# Patient Record
Sex: Female | Born: 1985 | Race: White | Hispanic: No | Marital: Single | Smoking: Current some day smoker
Health system: Southern US, Community
[De-identification: ages and names within clinical notes are randomized; demographics above are authoritative.]

## PROBLEM LIST (undated history)

## (undated) DIAGNOSIS — F32A Depression, unspecified: Secondary | ICD-10-CM

## (undated) DIAGNOSIS — G7000 Myasthenia gravis without (acute) exacerbation: Secondary | ICD-10-CM

## (undated) DIAGNOSIS — R569 Unspecified convulsions: Secondary | ICD-10-CM

## (undated) DIAGNOSIS — F329 Major depressive disorder, single episode, unspecified: Secondary | ICD-10-CM

## (undated) HISTORY — PX: MOUTH SURGERY: SHX715

## (undated) HISTORY — PX: OTHER SURGICAL HISTORY: SHX169

## (undated) HISTORY — PX: LEG SURGERY: SHX1003

---
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
## 2006-08-28 ENCOUNTER — Emergency Department (HOSPITAL_COMMUNITY): Admission: EM | Admit: 2006-08-28 | Discharge: 2006-08-28 | Payer: Self-pay | Admitting: Emergency Medicine

## 2006-09-04 ENCOUNTER — Emergency Department (HOSPITAL_COMMUNITY): Admission: EM | Admit: 2006-09-04 | Discharge: 2006-09-04 | Payer: Self-pay | Admitting: Emergency Medicine

## 2006-09-06 ENCOUNTER — Emergency Department (HOSPITAL_COMMUNITY): Admission: EM | Admit: 2006-09-06 | Discharge: 2006-09-06 | Payer: Self-pay | Admitting: Emergency Medicine

## 2006-09-17 ENCOUNTER — Emergency Department (HOSPITAL_COMMUNITY): Admission: EM | Admit: 2006-09-17 | Discharge: 2006-09-17 | Payer: Self-pay | Admitting: Emergency Medicine

## 2006-10-31 ENCOUNTER — Encounter (INDEPENDENT_AMBULATORY_CARE_PROVIDER_SITE_OTHER): Payer: Self-pay | Admitting: Unknown Physician Specialty

## 2006-10-31 ENCOUNTER — Other Ambulatory Visit: Admission: RE | Admit: 2006-10-31 | Discharge: 2006-10-31 | Payer: Self-pay | Admitting: Unknown Physician Specialty

## 2007-05-09 ENCOUNTER — Emergency Department (HOSPITAL_COMMUNITY): Admission: EM | Admit: 2007-05-09 | Discharge: 2007-05-09 | Payer: Self-pay | Admitting: Emergency Medicine

## 2007-08-20 ENCOUNTER — Emergency Department (HOSPITAL_COMMUNITY): Admission: EM | Admit: 2007-08-20 | Discharge: 2007-08-20 | Payer: Self-pay | Admitting: Emergency Medicine

## 2007-12-25 ENCOUNTER — Other Ambulatory Visit: Admission: RE | Admit: 2007-12-25 | Discharge: 2007-12-25 | Payer: Self-pay | Admitting: Unknown Physician Specialty

## 2008-03-11 ENCOUNTER — Emergency Department (HOSPITAL_COMMUNITY): Admission: EM | Admit: 2008-03-11 | Discharge: 2008-03-11 | Payer: Self-pay | Admitting: Emergency Medicine

## 2008-05-17 ENCOUNTER — Emergency Department (HOSPITAL_COMMUNITY): Admission: EM | Admit: 2008-05-17 | Discharge: 2008-05-17 | Payer: Self-pay | Admitting: Emergency Medicine

## 2009-09-14 ENCOUNTER — Emergency Department (HOSPITAL_COMMUNITY): Admission: EM | Admit: 2009-09-14 | Discharge: 2009-09-14 | Payer: Self-pay | Admitting: Emergency Medicine

## 2009-12-12 ENCOUNTER — Emergency Department (HOSPITAL_COMMUNITY): Admission: EM | Admit: 2009-12-12 | Discharge: 2009-12-12 | Payer: Self-pay | Admitting: Emergency Medicine

## 2010-04-14 ENCOUNTER — Emergency Department (HOSPITAL_COMMUNITY)
Admission: EM | Admit: 2010-04-14 | Discharge: 2010-04-14 | Payer: Self-pay | Source: Home / Self Care | Admitting: Emergency Medicine

## 2010-06-13 LAB — HEPATIC FUNCTION PANEL
ALT: 10 U/L (ref 0–35)
Albumin: 3.5 g/dL (ref 3.5–5.2)
Bilirubin, Direct: <0.1 mg/dL (ref 0.0–0.3)
Total Bilirubin: 0.3 mg/dL (ref 0.3–1.2)
Total Protein: 6.2 g/dL (ref 6.0–8.3)

## 2010-06-13 LAB — DIFFERENTIAL
Basophils Absolute: 0 10*3/uL (ref 0.0–0.1)
Basophils Relative: 1 % (ref 0–1)
Lymphocytes Relative: 37 % (ref 12–46)
Monocytes Absolute: 0.6 10*3/uL (ref 0.1–1.0)
Neutro Abs: 2.4 10*3/uL (ref 1.7–7.7)

## 2010-06-13 LAB — URINALYSIS, ROUTINE W REFLEX MICROSCOPIC
Leukocytes, UA: NEGATIVE
Nitrite: NEGATIVE
Urobilinogen, UA: 0.2 mg/dL (ref 0.0–1.0)

## 2010-06-13 LAB — URINE MICROSCOPIC-ADD ON

## 2010-06-13 LAB — CBC
MCV: 83.3 fL (ref 78.0–100.0)
Platelets: 183 10*3/uL (ref 150–400)
RBC: 3.86 MIL/uL — ABNORMAL LOW (ref 3.87–5.11)
RDW: 16.2 % — ABNORMAL HIGH (ref 11.5–15.5)
WBC: 4.8 10*3/uL (ref 4.0–10.5)

## 2010-06-13 LAB — BASIC METABOLIC PANEL
Calcium: 8.9 mg/dL (ref 8.4–10.5)
Creatinine, Ser: 0.53 mg/dL (ref 0.4–1.2)
GFR calc Af Amer: >60 mL/min (ref >60)
GFR calc non Af Amer: >60 mL/min (ref >60)

## 2010-06-13 LAB — PREGNANCY, URINE: Preg Test, Ur: NEGATIVE

## 2010-06-28 ENCOUNTER — Emergency Department (HOSPITAL_COMMUNITY)
Admission: EM | Admit: 2010-06-28 | Discharge: 2010-06-28 | Disposition: A | Discharge status: Home / Self Care | Payer: Self-pay | Attending: Emergency Medicine | Admitting: Emergency Medicine

## 2010-06-28 DIAGNOSIS — K089 Disorder of teeth and supporting structures, unspecified: Secondary | ICD-10-CM | POA: Insufficient documentation

## 2010-07-11 ENCOUNTER — Emergency Department (HOSPITAL_COMMUNITY)
Admission: EM | Admit: 2010-07-11 | Discharge: 2010-07-11 | Disposition: A | Discharge status: Home / Self Care | Payer: Self-pay | Attending: Emergency Medicine | Admitting: Emergency Medicine

## 2010-07-11 DIAGNOSIS — K089 Disorder of teeth and supporting structures, unspecified: Secondary | ICD-10-CM | POA: Insufficient documentation

## 2010-07-11 DIAGNOSIS — N390 Urinary tract infection, site not specified: Secondary | ICD-10-CM | POA: Insufficient documentation

## 2010-07-11 DIAGNOSIS — R300 Dysuria: Secondary | ICD-10-CM | POA: Insufficient documentation

## 2010-07-11 LAB — URINALYSIS, ROUTINE W REFLEX MICROSCOPIC
Glucose, UA: NEGATIVE mg/dL
Protein, ur: 30 mg/dL — AB
Specific Gravity, Urine: 1.025 (ref 1.005–1.030)
Urobilinogen, UA: 0.2 mg/dL (ref 0.0–1.0)

## 2010-07-11 LAB — URINE MICROSCOPIC-ADD ON

## 2010-07-11 LAB — POCT PREGNANCY, URINE: Preg Test, Ur: NEGATIVE

## 2010-08-03 ENCOUNTER — Emergency Department (HOSPITAL_COMMUNITY)
Admission: EM | Admit: 2010-08-03 | Discharge: 2010-08-03 | Disposition: A | Discharge status: Home / Self Care | Payer: Self-pay | Attending: Emergency Medicine | Admitting: Emergency Medicine

## 2010-08-03 DIAGNOSIS — R110 Nausea: Secondary | ICD-10-CM | POA: Insufficient documentation

## 2010-08-03 DIAGNOSIS — K089 Disorder of teeth and supporting structures, unspecified: Secondary | ICD-10-CM | POA: Insufficient documentation

## 2010-08-03 DIAGNOSIS — G40802 Other epilepsy, not intractable, without status epilepticus: Secondary | ICD-10-CM | POA: Insufficient documentation

## 2010-08-03 DIAGNOSIS — G43909 Migraine, unspecified, not intractable, without status migrainosus: Secondary | ICD-10-CM | POA: Insufficient documentation

## 2010-08-03 DIAGNOSIS — K029 Dental caries, unspecified: Secondary | ICD-10-CM | POA: Insufficient documentation

## 2010-08-13 NOTE — Discharge Summary (Signed)
NAME:  Mallory Mitchell, Mallory Mitchell NO.:  [id number]   MEDICAL RECORD NO.:  [id number]          PATIENT TYPE:  IPS   LOCATION:  0301                          FACILITY:  [facility]   PHYSICIAN:  Jeanice Lim, M.D. DATE OF BIRTH:  May 22, 1985   DATE OF ADMISSION:  04/04/2005  DATE OF DISCHARGE:  04/07/2005                                 DISCHARGE SUMMARY   IDENTIFYING DATA:  This is an 25 year old Caucasian female, single,  voluntarily admitted with a history of polysubstance abuse since age 62,  requesting help with Xanax detox, having used Xanax up to 10 mg a day for  anxiety, getting this off the street.  Mother had kicked her out of the  house as per patient and she will live with aunt now.  Had been using Xanax  daily for two years with four months of being clean otherwise.  Initially,  Percocet.  Wants to be clean.  Would not mind if someone killed her or if  she died.  Describing positive suicidal ideation.  This is the first  inpatient treatment.   MEDICATIONS:  Xanax off the street, 10 tabs daily, peach or blue, occasional  Valium.   ALLERGIES:  No known drug allergies.   PHYSICAL EXAMINATION:  Physical and neurologic exam essentially within  normal limits.   MENTAL STATUS EXAM:  Fully alert, anxious affect, cooperative.  Poor eye  contact.  Speech within normal limits.  Mood anxious.  Thought processes  adequate with positive suicidal ideation without plan.  Cognition intact.  Judgment and insight impaired.  Impulse control questionable.   ADMISSION DIAGNOSES:  AXIS I:  Benzodiazepine dependence.  Partial  withdrawal syndrome.  Polysubstance abuse.  Mood disorder not otherwise  specified.  Rule out substance-induced mood disorder.  AXIS II:  None.  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  AXIS IV:  Moderate (stressors related to possible question regarding  stability of living situation and other sequelae of substance use.  AXIS V:  30/58.   HOSPITAL COURSE:  The patient was admitted and ordered routine p.r.n.  medications and underwent further monitoring.  Was encouraged to participate  in individual, group and milieu therapy.  The patient was placed on Librium  detox protocol for safe withdrawal.  Required multiple p.r.n.'s and also  monitored for opiate withdrawal.  The patient was decreased on Zoloft to  minimize mood induction and patient was stabilized on medications.  The  patient reported a positive response, participated in treatment, denied any  suicidal thoughts and her mood was stable.  Affect bright.  Judgment and  insight improved.  Thought processes goal directed.  There are no risk  issues, psychotic symptoms, dangerous ideation or withdrawal symptoms at the  time of discharge.  The patient was given medication education review again.   DISCHARGE MEDICATIONS:  1.  Zoloft 50 mg q.a.m.  2.  Depakene 250 mg and 5 mL, 15 mL totaling 750 mg at 8 p.m.  3.  Seroquel 50 mg, 1 every six hours as needed for agitation and 150 mg at  8 p.m.   FOLLOW UP:  The patient was to follow up with Electa Sniff on April 14, 2005  at 1:30 p.m.   DISCHARGE DIAGNOSES:  AXIS I:  Benzodiazepine dependence.  Partial  withdrawal syndrome.  Polysubstance abuse.  Mood disorder not otherwise  specified.  Rule out substance-induced mood disorder.  AXIS II:  None.  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  AXIS IV:  Moderate (stressors related to possible question regarding  stability of living situation and other sequelae of substance use.  AXIS V:  GAF on discharge 55.      Jeanice Lim, M.D.  Electronically Signed     JEM/MEDQ  D:  05/02/2005  T:  05/02/2005  Job:  [id number]

## 2010-08-18 ENCOUNTER — Emergency Department (HOSPITAL_COMMUNITY)
Admission: EM | Admit: 2010-08-18 | Discharge: 2010-08-18 | Disposition: A | Discharge status: Home / Self Care | Payer: Self-pay | Attending: Emergency Medicine | Admitting: Emergency Medicine

## 2010-08-18 DIAGNOSIS — G40802 Other epilepsy, not intractable, without status epilepticus: Secondary | ICD-10-CM | POA: Insufficient documentation

## 2010-08-18 DIAGNOSIS — G43909 Migraine, unspecified, not intractable, without status migrainosus: Secondary | ICD-10-CM | POA: Insufficient documentation

## 2010-08-18 DIAGNOSIS — K089 Disorder of teeth and supporting structures, unspecified: Secondary | ICD-10-CM | POA: Insufficient documentation

## 2010-09-12 ENCOUNTER — Emergency Department (HOSPITAL_COMMUNITY)
Admission: EM | Admit: 2010-09-12 | Discharge: 2010-09-12 | Disposition: A | Discharge status: Home / Self Care | Payer: Self-pay | Attending: Emergency Medicine | Admitting: Emergency Medicine

## 2010-09-12 DIAGNOSIS — K029 Dental caries, unspecified: Secondary | ICD-10-CM | POA: Insufficient documentation

## 2010-09-12 DIAGNOSIS — G40909 Epilepsy, unspecified, not intractable, without status epilepticus: Secondary | ICD-10-CM | POA: Insufficient documentation

## 2010-11-02 ENCOUNTER — Emergency Department (HOSPITAL_COMMUNITY)
Admission: EM | Admit: 2010-11-02 | Discharge: 2010-11-02 | Disposition: A | Discharge status: Home / Self Care | Payer: Self-pay | Attending: Emergency Medicine | Admitting: Emergency Medicine

## 2010-11-02 ENCOUNTER — Encounter: Payer: Self-pay | Admitting: *Deleted

## 2010-11-02 DIAGNOSIS — K0889 Other specified disorders of teeth and supporting structures: Secondary | ICD-10-CM

## 2010-11-02 DIAGNOSIS — F17200 Nicotine dependence, unspecified, uncomplicated: Secondary | ICD-10-CM | POA: Insufficient documentation

## 2010-11-02 DIAGNOSIS — K089 Disorder of teeth and supporting structures, unspecified: Secondary | ICD-10-CM | POA: Insufficient documentation

## 2010-11-02 DIAGNOSIS — K029 Dental caries, unspecified: Secondary | ICD-10-CM | POA: Insufficient documentation

## 2010-11-02 DIAGNOSIS — G40909 Epilepsy, unspecified, not intractable, without status epilepticus: Secondary | ICD-10-CM | POA: Insufficient documentation

## 2010-11-02 HISTORY — DX: Unspecified convulsions: R56.9

## 2010-11-02 MED ORDER — PENICILLIN V POTASSIUM 500 MG PO TABS: 500.0000 mg | ORAL_TABLET | Freq: Three times a day (TID) | ORAL | Status: AC

## 2010-11-02 MED ORDER — KETOROLAC TROMETHAMINE 60 MG/2ML IM SOLN
60.0000 mg | Freq: Once | INTRAMUSCULAR | Status: AC
  Administered 2010-11-02: 60 mg via INTRAMUSCULAR
  Filled 2010-11-02: qty 2

## 2010-11-02 NOTE — ED Notes (Signed)
Pt c/o toothache pain to top teeth

## 2010-11-02 NOTE — ED Provider Notes (Signed)
History     CSN: [id number] Arrival date & time: 11/02/2010  4:34 AM  Chief Complaint  Patient presents with  . Dental Pain   HPI Comments: Patient has a history of having frequent visits to the emergency department for toothaches. I saw her for the same problem in May of this year. She has been prescribed multiple prescriptions for narcotic medicines. During a recent psychiatric evaluation it was noted that she had been abusing Xanax and has a history of abusing Percocet.  Patient is a 25 y.o. female presenting with tooth pain. The history is provided by the patient and medical records.  Dental PainThe primary symptoms include mouth pain. Primary symptoms do not include dental injury, oral bleeding, oral lesions, headaches, fever, shortness of breath, sore throat, angioedema or cough. The symptoms began yesterday. The symptoms are unchanged. The symptoms are recurrent. The symptoms occur frequently.  Additional symptoms do not include: trismus, jaw pain, facial swelling, dry mouth, taste disturbance, ear pain, swollen glands and fatigue. Associated medical issues comments: Patient has a significant history for abusing Xanax and Percocet.    Past Medical History  Diagnosis Date  . Seizures     Past Surgical History  Procedure Date  . Mouth surgery     History reviewed. No pertinent family history.  History  Substance Use Topics  . Smoking status: Current Some Day Smoker  . Smokeless tobacco: Not on file  . Alcohol Use: No    OB History    Grav Para Term Preterm Abortions TAB SAB Ect Mult Living                  Review of Systems  Constitutional: Negative for fever and fatigue.  HENT: Positive for dental problem. Negative for ear pain, sore throat and facial swelling.   Respiratory: Negative for cough and shortness of breath.   Neurological: Negative for headaches.    Physical Exam  BP 116/62  Pulse 95  Temp(Src) 98.5 F (36.9 C) (Oral)  Resp 20  Ht 5\' 3"  (1.6 m)   Wt 125 lb (56.7 kg)  BMI 22.14 kg/m2  SpO2 100%  LMP 10/24/2010  Physical Exam  Nursing note and vitals reviewed. Constitutional: She appears well-developed and well-nourished. No distress.  HENT:  Head: Normocephalic and atraumatic.  Mouth/Throat: Oropharynx is clear and moist. No oropharyngeal exudate.       Dental Disease - diffuse over the diffuse upper teeth. Severe degradation of the left upper canine  Eyes: Conjunctivae are normal. No scleral icterus.  Neck: Normal range of motion. Neck supple. No thyromegaly present.  Cardiovascular: Normal rate and regular rhythm.   Pulmonary/Chest: Effort normal and breath sounds normal.  Lymphadenopathy:    She has no cervical adenopathy.  Neurological: She is alert.  Skin: Skin is warm and dry. No rash noted. She is not diaphoretic.    ED Course  Procedures  MDM History of drug-seeking behavior and substance abuse. Has obvious dental caries but no signs of periapical abscesses or gingival abscesses. Jaws are symmetrical and nontender and there is no cervical lymphadenopathy, fevers, tachycardia, hypotension. Patient has been given intramuscular Toradol while in the emergency department and informed that she would not receive any prescriptions for narcotics to go. Penicillin given. Patient has expressed her understanding of need for followup with a dentist.      Vida Roller, MD 11/02/10 [phone]

## 2010-12-17 LAB — URINALYSIS, ROUTINE W REFLEX MICROSCOPIC
Glucose, UA: NEGATIVE
Leukocytes, UA: NEGATIVE
Nitrite: NEGATIVE

## 2010-12-17 LAB — URINE MICROSCOPIC-ADD ON

## 2011-01-13 LAB — URINE MICROSCOPIC-ADD ON

## 2011-01-13 LAB — URINALYSIS, ROUTINE W REFLEX MICROSCOPIC

## 2011-01-13 LAB — URINE CULTURE
Colony Count: NO GROWTH
Culture: NO GROWTH

## 2011-01-29 ENCOUNTER — Emergency Department (HOSPITAL_COMMUNITY)
Admission: EM | Admit: 2011-01-29 | Discharge: 2011-01-29 | Disposition: A | Discharge status: Home / Self Care | Payer: Self-pay | Attending: Emergency Medicine | Admitting: Emergency Medicine

## 2011-01-29 ENCOUNTER — Encounter (HOSPITAL_COMMUNITY): Payer: Self-pay

## 2011-01-29 ENCOUNTER — Emergency Department (HOSPITAL_COMMUNITY): Payer: Self-pay

## 2011-01-29 DIAGNOSIS — R569 Unspecified convulsions: Secondary | ICD-10-CM | POA: Insufficient documentation

## 2011-01-29 DIAGNOSIS — J029 Acute pharyngitis, unspecified: Secondary | ICD-10-CM | POA: Insufficient documentation

## 2011-01-29 DIAGNOSIS — J069 Acute upper respiratory infection, unspecified: Secondary | ICD-10-CM | POA: Insufficient documentation

## 2011-01-29 HISTORY — DX: Major depressive disorder, single episode, unspecified: F32.9

## 2011-01-29 HISTORY — DX: Depression, unspecified: F32.A

## 2011-01-29 LAB — RAPID STREP SCREEN (MED CTR MEBANE ONLY): Streptococcus, Group A Screen (Direct): NEGATIVE

## 2011-01-29 LAB — POCT PREGNANCY, URINE: Preg Test, Ur: NEGATIVE

## 2011-01-29 MED ORDER — DEXAMETHASONE 4 MG PO TABS
4.0000 mg | ORAL_TABLET | Freq: Once | ORAL | Status: AC
  Administered 2011-01-29: 4 mg via ORAL
  Filled 2011-01-29: qty 1

## 2011-01-29 MED ORDER — DEXAMETHASONE 4 MG PO TABS
ORAL_TABLET | ORAL | Status: AC
  Filled 2011-01-29: qty 1

## 2011-01-29 NOTE — ED Notes (Signed)
Pt presents with sore throat, cough, headache, and laryngitis since 01/26/2011. Pt states she has a productive cough with green sputum. Pt denies fever.

## 2011-01-29 NOTE — ED Notes (Signed)
Pt states has "been like this for several days and will not get better. States lives with father and he is sick also. Pt has laryngitis with c/o sore throat. No cough heard. Pt denies fever and chills at this time.  No respiratory distress noted at this time.

## 2011-01-29 NOTE — ED Provider Notes (Signed)
History     CSN: [id number] Arrival date & time: 01/29/2011  5:16 PM   Chief Complaint  Patient presents with  . Sore Throat  . Laryngitis  . Cough     HPI Pt was seen at 1800.  Per pt, c/o gradual onset and persistence of constant runny/stuffy nose, sore throat, cough, "laryngitis," sinus and ears congestion x4 days.  Denies CP/palpitations, no SOB, no abd pain, no N/V/D, no fevers, no rash.    Past Medical History  Diagnosis Date  . Seizures   . Depression     Past Surgical History  Procedure Date  . Mouth surgery     Social History  . Marital Status: Single   Social History Main Topics  . Smoking status: Current Some Day Smoker  . Smokeless tobacco: None  . Alcohol Use: No  . Drug Use: No     Review of Systems ROS: Statement: All systems negative except as marked or noted in the HPI; Constitutional: Negative for fever and chills. ; ; Eyes: Negative for eye pain, redness and discharge. ; ; ENMT: Positive for ear pain, hoarseness, nasal congestion, sinus pressure and sore throat. ; ; Cardiovascular: Negative for chest pain, palpitations, diaphoresis, dyspnea and peripheral edema. ; ; Respiratory: +cough.  Negative for wheezing and stridor. ; ; Gastrointestinal: Negative for nausea, vomiting, diarrhea and abdominal pain, blood in stool, hematemesis, jaundice and rectal bleeding. . ; ; Genitourinary: Negative for dysuria, flank pain and hematuria. ; ; Musculoskeletal: Negative for back pain and neck pain. Negative for swelling and trauma.; ; Skin: Negative for pruritus, rash, abrasions, blisters, bruising and skin lesion.; ; Neuro: Negative for headache, lightheadedness and neck stiffness. Negative for weakness, altered level of consciousness , altered mental status, extremity weakness, paresthesias, involuntary movement, seizure and syncope.     Allergies  Review of patient's allergies indicates no known allergies.  Home Medications   Current Outpatient Rx  Name Route  Sig Dispense Refill  . DIVALPROEX SODIUM 500 MG PO TB24 Oral Take 500 mg by mouth 2 (two) times daily.        BP 115/59  Pulse 78  Temp(Src) 99.4 F (37.4 C) (Oral)  Resp 20  Ht 5\' 3"  (1.6 m)  Wt 130 lb (58.968 kg)  BMI 23.03 kg/m2  SpO2 100%  LMP 01/19/2011  Physical Exam 1805: Physical examination:  Nursing notes reviewed; Vital signs and O2 SAT reviewed;  Constitutional: Well developed, Well nourished, Well hydrated, In no acute distress; Head:  Normocephalic, atraumatic; Eyes: EOMI, PERRL, No scleral icterus; ENMT: TM's clear.  +edemetous nasal turbinates bilat with clear rhinorrhea. No intra-oral edema.  Mouth and pharynx normal, Mucous membranes moist; Neck: Supple, Full range of motion, No lymphadenopathy; Cardiovascular: Regular rate and rhythm, No murmur, rub, or gallop; Respiratory: Breath sounds clear & equal bilaterally, No rales, rhonchi, wheezes, or rub, Normal respiratory effort/excursion; Chest: Nontender, Movement normal; Extremities: Pulses normal, No tenderness, No edema, No calf edema or asymmetry.; Neuro: AA&Ox3, Major CN grossly intact.  No gross focal motor or sensory deficits in extremities.; Skin: Color normal, Warm, Dry, no rash.    ED Course  Procedures    MDM  MDM Reviewed: nursing note and vitals Interpretation: labs and x-ray   Results for orders placed during the hospital encounter of 01/29/11  RAPID STREP SCREEN      Component Value Range   Streptococcus, Group A Screen (Direct) NEGATIVE  NEGATIVE   POCT PREGNANCY, URINE      Component Value  Range   Preg Test, Ur NEGATIVE     Dg Chest 2 View  01/29/2011  *RADIOLOGY REPORT*  Clinical Data: Sore throat.  Horse.  CHEST - 2 VIEW  Comparison: None  Findings: Heart size is normal.  Mediastinal shadows are normal. The lungs are clear.  No effusions.  No bony abnormalities.  IMPRESSION: Normal chest  Original Report Authenticated By: Thomasenia Sales, M.D.   7:29 PM:  Pt wants to go home now.  Will tx  symptomatically at this time for viral illness. Dx testing d/w pt and family.  Questions answered.  Verb understanding, agreeable to d/c home with outpt f/u.   Threasa Kinch Allison Quarry, DO 01/31/11 1245

## 2011-08-08 ENCOUNTER — Emergency Department (HOSPITAL_COMMUNITY)
Admission: EM | Admit: 2011-08-08 | Discharge: 2011-08-08 | Disposition: A | Discharge status: Home / Self Care | Payer: Self-pay | Attending: Emergency Medicine | Admitting: Emergency Medicine

## 2011-08-08 ENCOUNTER — Encounter (HOSPITAL_COMMUNITY): Payer: Self-pay | Admitting: *Deleted

## 2011-08-08 DIAGNOSIS — F3289 Other specified depressive episodes: Secondary | ICD-10-CM | POA: Insufficient documentation

## 2011-08-08 DIAGNOSIS — K089 Disorder of teeth and supporting structures, unspecified: Secondary | ICD-10-CM | POA: Insufficient documentation

## 2011-08-08 DIAGNOSIS — K029 Dental caries, unspecified: Secondary | ICD-10-CM | POA: Insufficient documentation

## 2011-08-08 DIAGNOSIS — F329 Major depressive disorder, single episode, unspecified: Secondary | ICD-10-CM | POA: Insufficient documentation

## 2011-08-08 DIAGNOSIS — K0889 Other specified disorders of teeth and supporting structures: Secondary | ICD-10-CM

## 2011-08-08 MED ORDER — HYDROCODONE-ACETAMINOPHEN 5-325 MG PO TABS: 1.0000 | ORAL_TABLET | ORAL | Status: AC | PRN

## 2011-08-08 MED ORDER — IBUPROFEN 800 MG PO TABS
800.0000 mg | ORAL_TABLET | Freq: Once | ORAL | Status: AC
  Administered 2011-08-08: 800 mg via ORAL
  Filled 2011-08-08: qty 1

## 2011-08-08 MED ORDER — ONDANSETRON HCL 4 MG PO TABS
4.0000 mg | ORAL_TABLET | Freq: Once | ORAL | Status: AC
  Administered 2011-08-08: 4 mg via ORAL
  Filled 2011-08-08: qty 1

## 2011-08-08 MED ORDER — PENICILLIN V POTASSIUM 500 MG PO TABS: ORAL_TABLET | ORAL | Status: DC

## 2011-08-08 MED ORDER — HYDROCODONE-ACETAMINOPHEN 5-325 MG PO TABS
2.0000 | ORAL_TABLET | Freq: Once | ORAL | Status: AC
  Administered 2011-08-08: 2 via ORAL
  Filled 2011-08-08: qty 2

## 2011-08-08 MED ORDER — MELOXICAM 7.5 MG PO TABS: ORAL_TABLET | ORAL | Status: DC

## 2011-08-08 MED ORDER — PENICILLIN V POTASSIUM 250 MG PO TABS
500.0000 mg | ORAL_TABLET | Freq: Once | ORAL | Status: AC
  Administered 2011-08-08: 500 mg via ORAL
  Filled 2011-08-08: qty 2

## 2011-08-08 NOTE — ED Provider Notes (Signed)
History     CSN: [id number]  Arrival date & time 08/08/11  1404   First MD Initiated Contact with Patient 08/08/11 1544      Chief Complaint  Patient presents with  . Dental Pain    (Consider location/radiation/quality/duration/timing/severity/associated sxs/prior treatment) Patient is a 26 y.o. female presenting with tooth pain. The history is provided by the patient.  Dental PainThe primary symptoms include mouth pain and headaches. Primary symptoms do not include shortness of breath or cough. The symptoms began 2 days ago. The symptoms are worsening. The symptoms are recurrent. The symptoms occur frequently.  The headache is not associated with photophobia.  Additional symptoms include: dental sensitivity to temperature, gum tenderness, jaw pain and facial swelling. Additional symptoms do not include: trouble swallowing and nosebleeds. Medical issues include: smoking.    Past Medical History  Diagnosis Date  . Seizures   . Depression     Past Surgical History  Procedure Date  . Mouth surgery     History reviewed. No pertinent family history.  History  Substance Use Topics  . Smoking status: Current Some Day Smoker  . Smokeless tobacco: Not on file  . Alcohol Use: No    OB History    Grav Para Term Preterm Abortions TAB SAB Ect Mult Living                  Review of Systems  Constitutional: Negative for activity change.       All ROS Neg except as noted in HPI  HENT: Positive for facial swelling. Negative for nosebleeds, trouble swallowing and neck pain.   Eyes: Negative for photophobia and discharge.  Respiratory: Negative for cough, shortness of breath and wheezing.   Cardiovascular: Negative for chest pain and palpitations.  Gastrointestinal: Negative for abdominal pain and blood in stool.  Genitourinary: Negative for dysuria, frequency and hematuria.  Musculoskeletal: Negative for back pain and arthralgias.  Skin: Negative.   Neurological: Positive for  headaches. Negative for dizziness, seizures and speech difficulty.  Psychiatric/Behavioral: Negative for hallucinations and confusion.    Allergies  Review of patient's allergies indicates no known allergies.  Home Medications   Current Outpatient Rx  Name Route Sig Dispense Refill  . DIVALPROEX SODIUM ER 500 MG PO TB24 Oral Take 500 mg by mouth 2 (two) times daily.      Marland Kitchen HYDROCODONE-ACETAMINOPHEN 5-325 MG PO TABS Oral Take 1 tablet by mouth every 4 (four) hours as needed for pain. 20 tablet 0  . MELOXICAM 7.5 MG PO TABS  1 po bid with food 12 tablet 0  . PENICILLIN V POTASSIUM 500 MG PO TABS  2 po bid with food 28 tablet 0    BP 99/61  Pulse 70  Temp(Src) 98.1 F (36.7 C) (Oral)  Resp 20  Ht 5\' 3"  (1.6 m)  Wt 128 lb (58.06 kg)  BMI 22.67 kg/m2  SpO2 98%  LMP 08/06/2011  Physical Exam  Nursing note and vitals reviewed. Constitutional: She is oriented to person, place, and time. She appears well-developed and well-nourished.  Non-toxic appearance.  HENT:  Head: Normocephalic.  Right Ear: Tympanic membrane and external ear normal.  Left Ear: Tympanic membrane and external ear normal.       Multiple dental caries. Mild swelling of the right lower gum in the 1st to 2nd molar area.  Eyes: EOM and lids are normal. Pupils are equal, round, and reactive to light.  Neck: Normal range of motion. Neck supple. Carotid bruit is not  present.  Cardiovascular: Normal rate, regular rhythm, normal heart sounds, intact distal pulses and normal pulses.   Pulmonary/Chest: Breath sounds normal. No respiratory distress.  Abdominal: Soft. Bowel sounds are normal. There is no tenderness. There is no guarding.  Musculoskeletal: Normal range of motion.  Lymphadenopathy:       Head (right side): No submandibular adenopathy present.       Head (left side): No submandibular adenopathy present.    She has no cervical adenopathy.  Neurological: She is alert and oriented to person, place, and time. She  has normal strength. No cranial nerve deficit or sensory deficit.  Skin: Skin is warm and dry.  Psychiatric: Her speech is normal. Her mood appears anxious.    ED Course  Procedures (including critical care time)  Labs Reviewed - No data to display No results found.   1. Toothache       MDM  I have reviewed nursing notes, vital signs, and all appropriate lab and imaging results for this patient. The  [facility] resource was given to the patient. Rx for penicillin 500mg , mobic 7.5mg , and Norco 5mg  #20.       Kathie Dike, [place] 08/08/11 [phone]

## 2011-08-08 NOTE — Discharge Instructions (Signed)
Toothache Toothaches are usually caused by tooth decay (cavity). However, other causes of toothache include:  Gum disease.   Cracked tooth.   Cracked filling.   Injury.   Jaw problem (temporo mandibular joint or TMJ disorder).   Tooth abscess.   Root sensitivity.   Grinding.   Eruption problems.  Swelling and redness around a painful tooth often means you have a dental abscess. Pain medicine and antibiotics can help reduce symptoms, but you will need to see a dentist within the next few days to have your problem properly evaluated and treated. If tooth decay is the problem, you may need a filling or root canal to save your tooth. If the problem is more severe, your tooth may need to be pulled. SEEK IMMEDIATE MEDICAL CARE IF:  You cannot swallow.   You develop severe swelling, increased redness, or increased pain in your mouth or face.   You have a fever.   You cannot open your mouth adequately.  Document Released: 04/21/2004 Document Revised: 03/03/2011 Document Reviewed: 06/11/2009 [employer] Patient Information 2012 [employer]. 

## 2011-08-08 NOTE — ED Notes (Signed)
Dental pain for 2 days,rt mandibular molar

## 2011-08-11 NOTE — ED Provider Notes (Signed)
Medical screening examination/treatment/procedure(s) were performed by non-physician practitioner and as supervising physician I was immediately available for consultation/collaboration.  Carlester Kasparek, MD 08/11/11 0717 

## 2011-11-29 DIAGNOSIS — Z0000 Encounter for general adult medical examination without abnormal findings: Secondary | ICD-10-CM

## 2011-12-02 ENCOUNTER — Emergency Department (HOSPITAL_COMMUNITY)
Admission: EM | Admit: 2011-12-02 | Discharge: 2011-12-03 | Disposition: A | Discharge status: Home / Self Care | Payer: Self-pay | Attending: Emergency Medicine | Admitting: Emergency Medicine

## 2011-12-02 ENCOUNTER — Encounter (HOSPITAL_COMMUNITY): Payer: Self-pay | Admitting: Family Medicine

## 2011-12-02 DIAGNOSIS — F3289 Other specified depressive episodes: Secondary | ICD-10-CM | POA: Insufficient documentation

## 2011-12-02 DIAGNOSIS — F1910 Other psychoactive substance abuse, uncomplicated: Secondary | ICD-10-CM | POA: Insufficient documentation

## 2011-12-02 DIAGNOSIS — F17200 Nicotine dependence, unspecified, uncomplicated: Secondary | ICD-10-CM | POA: Insufficient documentation

## 2011-12-02 DIAGNOSIS — F329 Major depressive disorder, single episode, unspecified: Secondary | ICD-10-CM | POA: Insufficient documentation

## 2011-12-02 LAB — CBC WITH DIFFERENTIAL/PLATELET
Basophils Relative: 0 % (ref 0–1)
Eosinophils Absolute: 0 10*3/uL (ref 0.0–0.7)
HCT: 38.3 % (ref 36.0–46.0)
Hemoglobin: 13.2 g/dL (ref 12.0–15.0)
MCH: 29.7 pg (ref 26.0–34.0)
MCHC: 34.5 g/dL (ref 30.0–36.0)
Monocytes Absolute: 0.6 10*3/uL (ref 0.1–1.0)
Monocytes Relative: 5 % (ref 3–12)
Neutrophils Relative %: 76 % (ref 43–77)

## 2011-12-02 LAB — COMPREHENSIVE METABOLIC PANEL
Albumin: 3.8 g/dL (ref 3.5–5.2)
BUN: 19 mg/dL (ref 6–23)
Creatinine, Ser: 0.57 mg/dL (ref 0.50–1.10)
Total Protein: 7.4 g/dL (ref 6.0–8.3)

## 2011-12-02 LAB — RAPID URINE DRUG SCREEN, HOSP PERFORMED
Amphetamines: NOT DETECTED
Benzodiazepines: NOT DETECTED
Cocaine: POSITIVE — AB
Opiates: NOT DETECTED

## 2011-12-02 LAB — ETHANOL: Alcohol, Ethyl (B): <11 mg/dL (ref 0–11)

## 2011-12-02 MED ORDER — DIVALPROEX SODIUM ER 500 MG PO TB24
500.0000 mg | ORAL_TABLET | Freq: Two times a day (BID) | ORAL | Status: DC
  Administered 2011-12-02 – 2011-12-03 (×3): 500 mg via ORAL
  Filled 2011-12-02 (×4): qty 1

## 2011-12-02 NOTE — ED Notes (Signed)
Pt changed into paper scrubs, personal items collected, and security at bedside to wand the pt.

## 2011-12-02 NOTE — ED Notes (Signed)
Pt reporting detox from xanax, cocaine, marijuana, pain pills. Last used on Wednesday. Denying any SI or HI. Has tried detox once before unsuccessfully. Pt lives in [place], not working at this time. Has boyfriend at bedside. Denying any disruption in support system.

## 2011-12-02 NOTE — ED Notes (Signed)
Pt boyfriend information cell: [id number]; home # [phone]

## 2011-12-02 NOTE — BH Assessment (Signed)
Assessment Note   Mallory Mitchell is an 26 y.o. female that has been referred to [facility] for multiple substance abuse and dependence issues, including Xanax (can take up to 30 QD-last use Wednesday, pm), Opiates (snorts up to 20 Percocet 10) QD- last use Wednesday, pm, Cocaine (snorts "a couple lines" QD- last use Wednesday pm, and Cannibus (multiple blunts QD- last use Wednesday).  Allegedly, pt had a withdrawal seizure from the Xanax withdrawals and was treated at [facility] on Tuesday night.  Pt has now been medically cleared.  Pt had a detox attempt last year at [facility] for 72 hours, but admits not being willing to discontinue use.  Pt denies SI, HI, or any active psychosis and denies any history of mental health treatment. Pt currently voices feeling anxious, clammy, sweaty, nauseaus, with a runny nose (with a COWs score of 10.  Pt denies legal history and reports having a supportive family.  Pt is able to contract for safety and is requesting inpatient treatment.  Axis I: Substance Dependence Axis II: Deferred Axis III:  Past Medical History  Diagnosis Date  . Seizures   . Depression    Axis IV: other psychosocial or environmental problems Axis V: 31-40 impairment in reality testing  Past Medical History:  Past Medical History  Diagnosis Date  . Seizures   . Depression     Past Surgical History  Procedure Date  . Mouth surgery     Family History: History reviewed. No pertinent family history.  Social History:  reports that she has been smoking.  She does not have any smokeless tobacco history on file. She reports that she drinks alcohol. She reports that she uses illicit drugs (Cocaine and Marijuana).  Additional Social History:  Alcohol / Drug Use Pain Medications: No; see MAR Prescriptions: none prescribed Over the Counter: No; see MAR History of alcohol / drug use?: Yes Substance #1 Name of Substance 1: Xanax 1 - Age of First Use: 17 1 - Amount (size/oz): up to 30 1  - Frequency: QD 1 - Duration: years 1 - Last Use / Amount: Wednesday pm, the 4th Substance #2 Name of Substance 2: Opioids 2 - Age of First Use: 18 2 - Amount (size/oz): 20 Percs 10 2 - Frequency: QD 2 - Duration: years 2 - Last Use / Amount: Wednesday, the 4th pm Substance #3 Name of Substance 3: Cannibus 3 - Age of First Use: 16 3 - Amount (size/oz): several blunts 3 - Frequency: QD 3 - Duration: years 3 - Last Use / Amount: Wednesday pm, the 4th  CIWA: CIWA-Ar BP: 117/73 mmHg Pulse Rate: 72  Nausea and Vomiting: mild nausea with no vomiting Tactile Disturbances: none Tremor: two Auditory Disturbances: not present Paroxysmal Sweats: barely perceptible sweating, palms moist Visual Disturbances: not present Anxiety: two Headache, Fullness in Head: none present Agitation: normal activity Orientation and Clouding of Sensorium: oriented and can do serial additions CIWA-Ar Total: 6  COWS: Clinical Opiate Withdrawal Scale (COWS) Resting Pulse Rate: Pulse Rate 80 or below Sweating: Subjective report of chills or flushing Restlessness: Able to sit still Pupil Size: Pupils pinned or normal size for room light Bone or Joint Aches: Mild diffuse discomfort Runny Nose or Tearing: Nose running or tearing GI Upset: nausea or loose stool Tremor: No tremor Yawning: No yawning Anxiety or Irritability: Patient reports increasing irritability or anxiousness Gooseflesh Skin: Piloerection of skin can be felt or hairs standing up on arms COWS Total Score: 10   Allergies: No  Known Allergies  Home Medications:  (Not in a hospital admission)  OB/GYN Status:  Patient's last menstrual period was 11/21/2011.  General Assessment Data Location of Assessment: [facility] ED Living Arrangements: Parent Can pt return to current living arrangement?: Yes Admission Status: Voluntary Is patient capable of signing voluntary admission?: Yes Transfer from: Acute Hospital Referral Source:  Self/Family/Friend  Education Status Is patient currently in school?: No  Risk to self Suicidal Ideation: No Suicidal Intent: No Is patient at risk for suicide?: No Suicidal Plan?: No Access to Means: No What has been your use of drugs/alcohol within the last 12 months?: Xanax and Opiates and Cocaine and Cannibus use almost daily Previous Attempts/Gestures: No How many times?: 0  Other Self Harm Risks: damaging and reckless and impulsive Triggers for Past Attempts: Unpredictable Intentional Self Injurious Behavior: None Family Suicide History: No Recent stressful life event(s): Turmoil (Comment);Financial Problems;Conflict (Comment) Persecutory voices/beliefs?: No Depression: Yes Substance abuse history and/or treatment for substance abuse?: Yes Suicide prevention information given to non-admitted patients: Not applicable  Risk to Others Homicidal Ideation: No Thoughts of Harm to Others: No Current Homicidal Intent: No Current Homicidal Plan: No Access to Homicidal Means: No Identified Victim: none reported History of harm to others?: No Assessment of Violence: None Noted Violent Behavior Description: none noted per pt Does patient have access to weapons?: No Criminal Charges Pending?: No Does patient have a court date: No  Psychosis Hallucinations: None noted Delusions: None noted  Mental Status Report Appear/Hygiene: Disheveled Eye Contact: Good Motor Activity: Unremarkable Speech: Logical/coherent Level of Consciousness: Quiet/awake Mood: Depressed;Apprehensive;Helpless;Irritable Affect: Apathetic;Depressed;Inconsistent with thought content Anxiety Level: Minimal Thought Processes: Relevant Judgement: Impaired Orientation: Person;Place;Time;Situation Obsessive Compulsive Thoughts/Behaviors: Moderate  Cognitive Functioning Concentration: Decreased Memory: Recent Intact;Remote Intact IQ: Average Insight: Poor Impulse Control: Poor Appetite: Poor Weight  Loss: 0  Weight Gain: 0  Sleep: Decreased Total Hours of Sleep:  (only sleeps with medication) Vegetative Symptoms: None  ADLScreening [facility]) Patient's cognitive ability adequate to safely complete daily activities?: Yes Patient able to express need for assistance with ADLs?: Yes Independently performs ADLs?: Yes (appropriate for developmental age)  Abuse/Neglect [facility]) Physical Abuse: Denies (boyfriend and her Father are supportive) Verbal Abuse: Denies Sexual Abuse: Denies  Prior Inpatient Therapy Prior Inpatient Therapy: Yes Prior Therapy Dates: 2012 Prior Therapy Facilty/Provider(s): [facility] Reason for Treatment: substance abuse  Prior Outpatient Therapy Prior Outpatient Therapy: No Prior Therapy Dates: no prior outpatient Prior Therapy Facilty/Provider(s): none noted Reason for Treatment: n/a  ADL Screening (condition at time of admission) Patient's cognitive ability adequate to safely complete daily activities?: Yes Patient able to express need for assistance with ADLs?: Yes Independently performs ADLs?: Yes (appropriate for developmental age)       Abuse/Neglect Assessment (Assessment to be complete while patient is alone) Physical Abuse: Denies (boyfriend and her Father are supportive) Verbal Abuse: Denies Sexual Abuse: Denies Exploitation of patient/patient's resources: Denies Self-Neglect: Denies Values / Beliefs Cultural Requests During Hospitalization: None Spiritual Requests During Hospitalization: None   Advance Directives (For Healthcare) Advance Directive: Patient does not have advance directive    Additional Information 1:1 In Past 12 Months?: No CIRT Risk: No Elopement Risk: No Does patient have medical clearance?: Yes     Disposition:  Referred to [facility]. Disposition Disposition of Patient: Referred to Patient referred to: [facility]  On Site Evaluation by:   Reviewed with Physician:     Angelica Ran 12/02/2011 3:16  PM

## 2011-12-02 NOTE — ED Notes (Signed)
Pt given coke. Resting comfortable, denies any complaints at this time.

## 2011-12-02 NOTE — ED Notes (Signed)
Pt sts she wants detox from benzodiazepines, opiates, marijuana, cocaine. sts uses some alcohol. sts last use Wednesday and is having withdrawels

## 2011-12-02 NOTE — ED Provider Notes (Signed)
History   This chart was scribed for Gerhard Munch, MD by Melba Coon. The patient was seen in room TR08C/TR08C and the patient's care was started at 12:24PM.    CSN: [id number]  Arrival date & time 12/02/11  1137   First MD Initiated Contact with Patient 12/02/11 1150      Chief Complaint  Patient presents with  . Addiction Problem    (Consider location/radiation/quality/duration/timing/severity/associated sxs/prior treatment) HPI Mallory Mitchell is a 26 y.o. female who presents to the Emergency Department for detox from benzodiazepines, opiates, marijuana, and cocaine with an onset today. Pt states that she wants to go to rehab and stopping using drugs. Pt has been to rehab once but did not follow through with it, states that this is her first "real attempt" at quitting. Withdrawal symptoms present; generalized body aches present. No IV drug usage; all drugs that she consumed were PO. No SI or HI. No known allergies. No other pertinent medical symptoms.   Past Medical History  Diagnosis Date  . Seizures   . Depression     Past Surgical History  Procedure Date  . Mouth surgery     History reviewed. No pertinent family history.  History  Substance Use Topics  . Smoking status: Current Some Day Smoker  . Smokeless tobacco: Not on file  . Alcohol Use: Yes    OB History    Grav Para Term Preterm Abortions TAB SAB Ect Mult Living                  Review of Systems 10 Systems reviewed and all are negative for acute change except as noted in the HPI.   Allergies  Review of patient's allergies indicates no known allergies.  Home Medications   Current Outpatient Rx  Name Route Sig Dispense Refill  . DIVALPROEX SODIUM ER 500 MG PO TB24 Oral Take 500 mg by mouth 2 (two) times daily.      . MELOXICAM 7.5 MG PO TABS  1 po bid with food 12 tablet 0  . PENICILLIN V POTASSIUM 500 MG PO TABS  2 po bid with food 28 tablet 0    BP 117/73  Pulse 72  Temp 99.2 F (37.3  C) (Oral)  Resp 16  SpO2 97%  LMP 11/21/2011  Physical Exam  Nursing note and vitals reviewed. Constitutional: She is oriented to person, place, and time. She appears well-developed and well-nourished.  HENT:  Head: Normocephalic and atraumatic.  Eyes: EOM are normal. Pupils are equal, round, and reactive to light.  Neck: Normal range of motion. Neck supple.  Cardiovascular: Normal rate, normal heart sounds and intact distal pulses.   Pulmonary/Chest: Effort normal and breath sounds normal.  Abdominal: Bowel sounds are normal. She exhibits no distension. There is no tenderness.  Musculoskeletal: Normal range of motion. She exhibits no edema and no tenderness.  Neurological: She is alert and oriented to person, place, and time. She has normal strength. No cranial nerve deficit or sensory deficit.  Skin: Skin is warm and dry. No rash noted.  Psychiatric: She has a normal mood and affect.    ED Course  Procedures (including critical care time)  DIAGNOSTIC STUDIES:   COORDINATION OF CARE:  12:30PM - blood w/u and drug screen panel will be ordered for the pt. Pt will be admitted to the ED and ACT team will be consulted.   Labs Reviewed  CBC WITH DIFFERENTIAL - Abnormal; Notable for the following:    WBC 11.4 (*)  Neutro Abs 8.6 (*)     All other components within normal limits  ACETAMINOPHEN LEVEL  COMPREHENSIVE METABOLIC PANEL  URINE RAPID DRUG SCREEN (HOSP PERFORMED)  ETHANOL   No results found.   No diagnosis found.    MDM  I personally performed the services described in this documentation, which was scribed in my presence. The recorded information has been reviewed and considered.  This young female, in no distress, presents with request for substance abuse detoxification.  The patient is medically clear for psychiatric evaluation  Gerhard Munch, MD 12/02/11 1422

## 2011-12-02 NOTE — ED Notes (Signed)
pt here requesting detox from cociane, marijuana, benzodiazapines, and "pain pills."

## 2011-12-02 NOTE — ED Notes (Signed)
Pt reports she has been aching, runny nose, cough and congestion. Has been feeling nauseous. States she has had seizures with withdrawal in the past. Has attempted detox 3 times in the past and has had seizures with each time. Pt reports chill and diaphoresis. Last time she used Xanex, marijuana, cocaine, and pain pills was Wednesday. States she had a seizure Tuesday and was seen at [facility] and was d/ced to wait for a rehab facility.

## 2011-12-03 MED ORDER — BISMUTH SUBSALICYLATE 262 MG/15ML PO SUSP
30.0000 mL | Freq: Once | ORAL | Status: AC
  Administered 2011-12-03: 30 mL via ORAL
  Filled 2011-12-03: qty 236

## 2011-12-03 MED ORDER — BISMUTH SUBSALICYLATE 262 MG PO CHEW: 524.0000 mg | CHEWABLE_TABLET | Freq: Once | ORAL | Status: DC

## 2011-12-03 NOTE — BH Assessment (Signed)
[facility] Assessment Progress Note      Accepted to [facility] w [facility] authorization, [id number].  To be picked up around 1400 unless patient finds her own ride earlier.

## 2011-12-03 NOTE — ED Provider Notes (Signed)
8:57 AM Filed Vitals:   12/03/11 0733  BP: 106/66  Pulse: 56  Temp:   Resp: 18   [facility] declined. Outpatient management of polysubstance abuse. Dc home in good condition. No opiates in her system  Lyanne Co, MD 12/03/11 [phone]

## 2011-12-03 NOTE — ED Notes (Signed)
Pt's boyfriend called back and said he and his mom would be here around 1130 since they are driving from [place].

## 2011-12-03 NOTE — ED Notes (Signed)
CIWA and COWS not completed as pt does not have ETOH or opiates in her system at the time of arrival or at present.

## 2011-12-03 NOTE — ED Notes (Signed)
Pt reports "upset stomach" and x1 episode of diarrhea while using restroom - pt requesting medication to help "settle her stomach." Dr. Effie Shy made aware of pt request, orders given for PO pepto bismol.

## 2012-01-11 ENCOUNTER — Emergency Department (HOSPITAL_COMMUNITY)
Admission: EM | Admit: 2012-01-11 | Discharge: 2012-01-11 | Disposition: A | Discharge status: Home / Self Care | Payer: Self-pay | Attending: Emergency Medicine | Admitting: Emergency Medicine

## 2012-01-11 ENCOUNTER — Encounter (HOSPITAL_COMMUNITY): Payer: Self-pay | Admitting: Emergency Medicine

## 2012-01-11 DIAGNOSIS — K047 Periapical abscess without sinus: Secondary | ICD-10-CM | POA: Insufficient documentation

## 2012-01-11 DIAGNOSIS — K029 Dental caries, unspecified: Secondary | ICD-10-CM

## 2012-01-11 DIAGNOSIS — F17200 Nicotine dependence, unspecified, uncomplicated: Secondary | ICD-10-CM | POA: Insufficient documentation

## 2012-01-11 MED ORDER — KETOROLAC TROMETHAMINE 60 MG/2ML IM SOLN
60.0000 mg | Freq: Once | INTRAMUSCULAR | Status: AC
  Administered 2012-01-11: 60 mg via INTRAMUSCULAR
  Filled 2012-01-11: qty 2

## 2012-01-11 MED ORDER — OXYCODONE-ACETAMINOPHEN 5-325 MG PO TABS
1.0000 | ORAL_TABLET | Freq: Once | ORAL | Status: AC
  Administered 2012-01-11: 1 via ORAL
  Filled 2012-01-11: qty 1

## 2012-01-11 MED ORDER — AMOXICILLIN 400 MG/5ML PO SUSR: 400.0000 mg | Freq: Three times a day (TID) | ORAL | Status: AC

## 2012-01-11 MED ORDER — OXYCODONE-ACETAMINOPHEN 5-325 MG PO TABS: 2.0000 | ORAL_TABLET | ORAL | Status: DC | PRN

## 2012-01-11 MED ORDER — AMOXICILLIN 250 MG/5ML PO SUSR
500.0000 mg | Freq: Once | ORAL | Status: AC
  Administered 2012-01-11: 500 mg via ORAL
  Filled 2012-01-11: qty 10

## 2012-01-11 NOTE — ED Provider Notes (Signed)
History     CSN: [id number]  Arrival date & time 01/11/12  2016   First MD Initiated Contact with Patient 01/11/12 2141      Chief Complaint  Patient presents with  . Dental Pain   HPI Mallory Mitchell is a 26 y.o. female who presents to the ED with dental pain. The pain has been a chronic problem for several months but yesterday got worse. Patient states that she began having swelling and increased pain in the right jaw and then area around the back tooth started draining pus. She has had gland swelling but no fever.  She request pain medication and antibiotics. The history was provided by the patient.  Past Medical History  Diagnosis Date  . Seizures   . Depression     Past Surgical History  Procedure Date  . Mouth surgery     History reviewed. No pertinent family history.  History  Substance Use Topics  . Smoking status: Current Some Day Smoker  . Smokeless tobacco: Not on file  . Alcohol Use: Yes     occ    OB History    Grav Para Term Preterm Abortions TAB SAB Ect Mult Living                  Review of Systems  Constitutional: Negative for fever and chills.  HENT: Positive for ear pain and dental problem. Negative for neck pain.   Eyes: Negative.   Respiratory: Negative for cough and wheezing.   Gastrointestinal: Positive for nausea. Abdominal pain: no pain but when swallowed pus got nauseated.  Neurological: Positive for headaches.  Psychiatric/Behavioral: Negative for agitation. The patient is not nervous/anxious.     Allergies  Review of patient's allergies indicates no known allergies.  Home Medications   Current Outpatient Rx  Name Route Sig Dispense Refill  . DIVALPROEX SODIUM ER 500 MG PO TB24 Oral Take 500 mg by mouth 2 (two) times daily.        BP 107/52  Pulse 93  Temp 98.3 F (36.8 C) (Oral)  Resp 16  Ht 5\' 3"  (1.6 m)  Wt 126 lb (57.153 kg)  BMI 22.32 kg/m2  SpO2 100%  LMP 01/11/2012  Physical Exam  Nursing note and vitals  reviewed. Constitutional: She is oriented to person, place, and time. She appears well-developed and well-nourished. No distress.  HENT:  Head: Normocephalic.  Right Ear: Tympanic membrane and external ear normal.  Left Ear: Tympanic membrane and external ear normal.  Mouth/Throat: Uvula is midline and mucous membranes are normal. Dental abscesses and dental caries present. No uvula swelling. No oropharyngeal exudate.    Eyes: EOM are normal. Pupils are equal, round, and reactive to light.  Neck: Neck supple.  Cardiovascular: Normal rate and regular rhythm.   Pulmonary/Chest: No respiratory distress. She has no wheezes.  Abdominal: Soft. There is no tenderness.  Musculoskeletal: Normal range of motion. She exhibits no edema.  Lymphadenopathy:    She has cervical adenopathy (right).  Neurological: She is alert and oriented to person, place, and time. No cranial nerve deficit.  Skin: Skin is warm and dry.  Psychiatric: She has a normal mood and affect.   Assessment: 26 y.o. female with dental pain   Dental abscess  Plan:  Antibiotics   Pain management   Dentist ASAP Discussed with the patient and all questioned fully answered.    Medication List     As of 01/11/2012 10:34 PM    START taking these  medications         amoxicillin 400 MG/5ML suspension   Commonly known as: AMOXIL   Take 5 mLs (400 mg total) by mouth 3 (three) times daily.      oxyCODONE-acetaminophen 5-325 MG per tablet   Commonly known as: PERCOCET/ROXICET   Take 2 tablets by mouth every 4 (four) hours as needed for pain.      ASK your doctor about these medications         divalproex 500 MG 24 hr tablet   Commonly known as: DEPAKOTE ER          Where to get your medications    These are the prescriptions that you need to pick up.   You may get these medications from any pharmacy.         amoxicillin 400 MG/5ML suspension   oxyCODONE-acetaminophen 5-325 MG per tablet            Procedures     Janne Napoleon, NP 01/11/12 2234

## 2012-01-11 NOTE — ED Notes (Signed)
States she is having pain in her right lower jaw, obvious dental carries.

## 2012-01-11 NOTE — ED Provider Notes (Signed)
Medical screening examination/treatment/procedure(s) were performed by non-physician practitioner and as supervising physician I was immediately available for consultation/collaboration. Devoria Albe, MD, Armando Gang   Ward Givens, MD 01/11/12 2236

## 2012-01-11 NOTE — ED Notes (Addendum)
Patient stated she has an abscess tooth and it "busted and drained pus yesterday." Complaining of severe lower right-sided dental pain.

## 2012-03-15 ENCOUNTER — Emergency Department (HOSPITAL_COMMUNITY)
Admission: EM | Admit: 2012-03-15 | Discharge: 2012-03-15 | Disposition: A | Discharge status: Home / Self Care | Payer: Self-pay | Attending: Emergency Medicine | Admitting: Emergency Medicine

## 2012-03-15 ENCOUNTER — Encounter (HOSPITAL_COMMUNITY): Payer: Self-pay

## 2012-03-15 DIAGNOSIS — K117 Disturbances of salivary secretion: Secondary | ICD-10-CM | POA: Insufficient documentation

## 2012-03-15 DIAGNOSIS — Z79899 Other long term (current) drug therapy: Secondary | ICD-10-CM | POA: Insufficient documentation

## 2012-03-15 DIAGNOSIS — G40909 Epilepsy, unspecified, not intractable, without status epilepticus: Secondary | ICD-10-CM | POA: Insufficient documentation

## 2012-03-15 DIAGNOSIS — K089 Disorder of teeth and supporting structures, unspecified: Secondary | ICD-10-CM | POA: Insufficient documentation

## 2012-03-15 DIAGNOSIS — Z9889 Other specified postprocedural states: Secondary | ICD-10-CM | POA: Insufficient documentation

## 2012-03-15 DIAGNOSIS — Z8659 Personal history of other mental and behavioral disorders: Secondary | ICD-10-CM | POA: Insufficient documentation

## 2012-03-15 DIAGNOSIS — K0889 Other specified disorders of teeth and supporting structures: Secondary | ICD-10-CM

## 2012-03-15 DIAGNOSIS — K055 Other periodontal diseases: Secondary | ICD-10-CM | POA: Insufficient documentation

## 2012-03-15 DIAGNOSIS — K029 Dental caries, unspecified: Secondary | ICD-10-CM | POA: Insufficient documentation

## 2012-03-15 DIAGNOSIS — F17200 Nicotine dependence, unspecified, uncomplicated: Secondary | ICD-10-CM | POA: Insufficient documentation

## 2012-03-15 MED ORDER — HYDROCODONE-ACETAMINOPHEN 5-325 MG PO TABS
1.0000 | ORAL_TABLET | Freq: Once | ORAL | Status: AC
  Administered 2012-03-15: 1 via ORAL
  Filled 2012-03-15: qty 1

## 2012-03-15 MED ORDER — AMOXICILLIN 250 MG PO CAPS
500.0000 mg | ORAL_CAPSULE | Freq: Once | ORAL | Status: AC
  Administered 2012-03-15: 500 mg via ORAL
  Filled 2012-03-15: qty 2

## 2012-03-15 MED ORDER — HYDROCODONE-ACETAMINOPHEN 5-325 MG PO TABS: ORAL_TABLET | ORAL | Status: DC

## 2012-03-15 MED ORDER — AMOXICILLIN 500 MG PO CAPS: 500.0000 mg | ORAL_CAPSULE | Freq: Three times a day (TID) | ORAL | Status: DC

## 2012-03-15 NOTE — ED Notes (Signed)
Pt reports toothache for 2 days to right lower

## 2012-03-15 NOTE — ED Provider Notes (Signed)
History     CSN: [id number]  Arrival date & time 03/15/12  4098   First MD Initiated Contact with Patient 03/15/12 1012      Chief Complaint  Patient presents with  . Dental Injury    (Consider location/radiation/quality/duration/timing/severity/associated sxs/prior treatment) Patient is a 26 y.o. female presenting with tooth pain. The history is provided by the patient.  Dental PainThe primary symptoms include mouth pain. Primary symptoms do not include dental injury, oral bleeding, headaches, fever, shortness of breath, sore throat, angioedema or cough. The symptoms began 2 days ago. The symptoms are unchanged. The symptoms are new. The symptoms occur constantly.  Affected locations include: gum(s) and teeth.  Additional symptoms include: dental sensitivity to temperature, gum swelling, gum tenderness and drooling. Additional symptoms do not include: purulent gums, trismus, jaw pain, facial swelling, trouble swallowing, pain with swallowing, ear pain and swollen glands. Medical issues include: smoking and periodontal disease.    Past Medical History  Diagnosis Date  . Seizures   . Depression     Past Surgical History  Procedure Date  . Mouth surgery     No family history on file.  History  Substance Use Topics  . Smoking status: Current Some Day Smoker    Types: Cigarettes  . Smokeless tobacco: Not on file  . Alcohol Use: Yes     Comment: occ    OB History    Grav Para Term Preterm Abortions TAB SAB Ect Mult Living                  Review of Systems  Constitutional: Negative for fever and appetite change.  HENT: Positive for drooling and dental problem. Negative for ear pain, congestion, sore throat, facial swelling, trouble swallowing, neck pain and neck stiffness.   Eyes: Negative for pain and visual disturbance.  Respiratory: Negative for cough and shortness of breath.   Neurological: Negative for dizziness, facial asymmetry and headaches.  Hematological:  Negative for adenopathy.  All other systems reviewed and are negative.    Allergies  Review of patient's allergies indicates no known allergies.  Home Medications   Current Outpatient Rx  Name  Route  Sig  Dispense  Refill  . DIVALPROEX SODIUM ER 500 MG PO TB24   Oral   Take 500 mg by mouth 2 (two) times daily.           . OXYCODONE-ACETAMINOPHEN 5-325 MG PO TABS   Oral   Take 2 tablets by mouth every 4 (four) hours as needed for pain.   15 tablet   0     BP 123/69  Pulse 82  Temp 98.3 F (36.8 C) (Oral)  Resp 18  Wt 131 lb (59.421 kg)  SpO2 100%  LMP 03/15/2012  Physical Exam  Nursing note and vitals reviewed. Constitutional: She is oriented to person, place, and time. She appears well-developed and well-nourished. No distress.  HENT:  Head: Normocephalic and atraumatic. No trismus in the jaw.  Right Ear: Tympanic membrane and ear canal normal.  Left Ear: Tympanic membrane and ear canal normal.  Mouth/Throat: Uvula is midline, oropharynx is clear and moist and mucous membranes are normal. Dental caries present. No dental abscesses or uvula swelling.         Extensive periodontal disease. Tenderness to palpation of the gums surrounding the right lower first molar. No obvious abscess. No facial edema or trismus.  Neck: Normal range of motion. Neck supple.  Cardiovascular: Normal rate, regular rhythm, normal heart sounds  and intact distal pulses.   No murmur heard. Pulmonary/Chest: Effort normal and breath sounds normal.  Musculoskeletal: Normal range of motion.  Lymphadenopathy:    She has no cervical adenopathy.  Neurological: She is alert and oriented to person, place, and time. She exhibits normal muscle tone. Coordination normal.  Skin: Skin is warm and dry.    ED Course  Procedures (including critical care time)  Labs Reviewed - No data to display No results found.      MDM   Widespread dental disease.  ttp of the right lower first molar.  Mild  edema of the surrounding gums.  No dental abscess, facial edema or trismus. Patient agrees to close followup with a dentist. Referral information was given.  Prescribed Norco #15 Amoxil     Devonta Blanford L. Gwyn Mehring, [place] 03/15/12 1404

## 2012-03-15 NOTE — ED Notes (Signed)
Instructions, prescriptions and f/u care reviewed - verbalizes understanding. Left in c/o family for transport home.

## 2012-03-20 NOTE — ED Provider Notes (Signed)
Medical screening examination/treatment/procedure(s) were performed by non-physician practitioner and as supervising physician I was immediately available for consultation/collaboration.  Donnetta Hutching, MD 03/20/12 [phone]

## 2012-04-30 ENCOUNTER — Encounter (HOSPITAL_COMMUNITY): Payer: Self-pay | Admitting: *Deleted

## 2012-04-30 ENCOUNTER — Emergency Department (HOSPITAL_COMMUNITY)
Admission: EM | Admit: 2012-04-30 | Discharge: 2012-04-30 | Disposition: A | Discharge status: Home / Self Care | Payer: Medicaid Other | Attending: Emergency Medicine | Admitting: Emergency Medicine

## 2012-04-30 DIAGNOSIS — F17200 Nicotine dependence, unspecified, uncomplicated: Secondary | ICD-10-CM | POA: Insufficient documentation

## 2012-04-30 DIAGNOSIS — G40909 Epilepsy, unspecified, not intractable, without status epilepticus: Secondary | ICD-10-CM | POA: Insufficient documentation

## 2012-04-30 DIAGNOSIS — Z9889 Other specified postprocedural states: Secondary | ICD-10-CM | POA: Insufficient documentation

## 2012-04-30 DIAGNOSIS — F1410 Cocaine abuse, uncomplicated: Secondary | ICD-10-CM | POA: Insufficient documentation

## 2012-04-30 DIAGNOSIS — K0889 Other specified disorders of teeth and supporting structures: Secondary | ICD-10-CM

## 2012-04-30 DIAGNOSIS — Z831 Family history of other infectious and parasitic diseases: Secondary | ICD-10-CM | POA: Insufficient documentation

## 2012-04-30 DIAGNOSIS — Z8659 Personal history of other mental and behavioral disorders: Secondary | ICD-10-CM | POA: Insufficient documentation

## 2012-04-30 DIAGNOSIS — F1210 Cannabis abuse, uncomplicated: Secondary | ICD-10-CM | POA: Insufficient documentation

## 2012-04-30 DIAGNOSIS — K089 Disorder of teeth and supporting structures, unspecified: Secondary | ICD-10-CM | POA: Insufficient documentation

## 2012-04-30 DIAGNOSIS — Z79899 Other long term (current) drug therapy: Secondary | ICD-10-CM | POA: Insufficient documentation

## 2012-04-30 MED ORDER — PENICILLIN V POTASSIUM 500 MG PO TABS: 500.0000 mg | ORAL_TABLET | Freq: Four times a day (QID) | ORAL | Status: AC

## 2012-04-30 MED ORDER — OXYCODONE-ACETAMINOPHEN 5-325 MG PO TABS
1.0000 | ORAL_TABLET | Freq: Once | ORAL | Status: AC
  Administered 2012-04-30: 1 via ORAL
  Filled 2012-04-30: qty 1

## 2012-04-30 MED ORDER — IBUPROFEN 800 MG PO TABS
800.0000 mg | ORAL_TABLET | Freq: Once | ORAL | Status: AC
  Administered 2012-04-30: 800 mg via ORAL
  Filled 2012-04-30: qty 1

## 2012-04-30 NOTE — ED Notes (Signed)
Pain lt upper molar since last night

## 2012-04-30 NOTE — ED Provider Notes (Signed)
History  This chart was scribed for Joya Gaskins, MD by Ardeen Jourdain, ED Scribe. This patient was seen in room APA11/APA11 and the patient's care was started at 1533.  CSN: [id number]  Arrival date & time 04/30/12  1434   First MD Initiated Contact with Patient 04/30/12 1533      Chief Complaint  Patient presents with  . Dental Pain     Patient is a 27 y.o. female presenting with tooth pain. The history is provided by the patient. No language interpreter was used.  Dental PainThe primary symptoms include mouth pain. Primary symptoms do not include dental injury, oral bleeding, oral lesions, headaches, fever, shortness of breath, sore throat, angioedema or cough. The symptoms began yesterday. The symptoms are worsening. The symptoms occur constantly.  Mouth pain began 24 -48 hours ago. Mouth pain occurs constantly. Mouth pain is unchanged. Affected locations include: gum(s) and teeth.   Additional symptoms include: gum tenderness and jaw pain. Additional symptoms do not include: drooling, ear pain, swollen glands and fatigue.    Mallory Mitchell is a 27 y.o. female who presents to the Emergency Department complaining of right sided dental pain that began last night and has been gradually worsening. She reports a h/o dental abscesses and states she thinks she has one again.    Past Medical History  Diagnosis Date  . Depression   . Seizures     Past Surgical History  Procedure Date  . Mouth surgery     History reviewed. No pertinent family history.  History  Substance Use Topics  . Smoking status: Current Some Day Smoker    Types: Cigarettes  . Smokeless tobacco: Not on file  . Alcohol Use: Yes     Comment: occ    No OB history available.    Review of Systems  Constitutional: Negative for fever and fatigue.  HENT: Positive for dental problem. Negative for ear pain, sore throat and drooling.   Respiratory: Negative for cough and shortness of breath.    Gastrointestinal: Negative for nausea, vomiting and diarrhea.  Neurological: Negative for headaches.    Allergies  Review of patient's allergies indicates no known allergies.  Home Medications   Current Outpatient Rx  Name  Route  Sig  Dispense  Refill  . AMOXICILLIN 500 MG PO CAPS   Oral   Take 1 capsule (500 mg total) by mouth 3 (three) times daily.   30 capsule   0   . DIVALPROEX SODIUM ER 500 MG PO TB24   Oral   Take 500 mg by mouth 2 (two) times daily.           Marland Kitchen HYDROCODONE-ACETAMINOPHEN 5-325 MG PO TABS      Take one-two tabs po q 4-6 hrs prn pain   15 tablet   0   . OXYCODONE-ACETAMINOPHEN 5-325 MG PO TABS   Oral   Take 2 tablets by mouth every 4 (four) hours as needed for pain.   15 tablet   0     Triage Vitals: BP 110/65  Pulse 92  Temp 98.1 F (36.7 C) (Oral)  Resp 20  Ht 5\' 3"  (1.6 m)  Wt 130 lb (58.968 kg)  BMI 23.03 kg/m2  SpO2 96%  LMP 04/28/2012  Physical Exam  CONSTITUTIONAL: Well developed/well nourished HEAD AND FACE: Normocephalic/atraumatic EYES: EOMI/PERRL ENMT: Mucous membranes moist.  Poor dentition.  No trismus.  No focal abscess noted. NECK: supple no meningeal signs CV: S1/S2 noted, no murmurs/rubs/gallops noted LUNGS:  Lungs are clear to auscultation bilaterally, no apparent distress ABDOMEN: soft, nontender, no rebound or guarding NEURO: Pt is awake/alert, moves all extremitiesx4 EXTREMITIES:full ROM SKIN: warm, color normal   ED Course  Procedures (including critical care time)  DIAGNOSTIC STUDIES: Oxygen Saturation is 96% on room air, adequate by my interpretation.    COORDINATION OF CARE:  3:53 [id number] treatment plan which includes pain medication and antibiotics with pt at bedside and pt agreed to plan.        MDM  Nursing notes including past medical history and social history reviewed and considered in documentation       I personally performed the services described in this documentation,  which was scribed in my presence. The recorded information has been reviewed and is accurate.      Joya Gaskins, MD 04/30/12 1910

## 2012-11-28 ENCOUNTER — Encounter (HOSPITAL_COMMUNITY): Payer: Self-pay

## 2012-11-28 ENCOUNTER — Emergency Department (HOSPITAL_COMMUNITY)
Admission: EM | Admit: 2012-11-28 | Discharge: 2012-11-28 | Disposition: A | Discharge status: Home / Self Care | Payer: Medicaid Other | Attending: Emergency Medicine | Admitting: Emergency Medicine

## 2012-11-28 DIAGNOSIS — S3141XA Laceration without foreign body of vagina and vulva, initial encounter: Secondary | ICD-10-CM

## 2012-11-28 DIAGNOSIS — Z8659 Personal history of other mental and behavioral disorders: Secondary | ICD-10-CM | POA: Insufficient documentation

## 2012-11-28 DIAGNOSIS — IMO0002 Reserved for concepts with insufficient information to code with codable children: Secondary | ICD-10-CM | POA: Insufficient documentation

## 2012-11-28 DIAGNOSIS — Y929 Unspecified place or not applicable: Secondary | ICD-10-CM | POA: Insufficient documentation

## 2012-11-28 DIAGNOSIS — Z3202 Encounter for pregnancy test, result negative: Secondary | ICD-10-CM | POA: Insufficient documentation

## 2012-11-28 DIAGNOSIS — Z8669 Personal history of other diseases of the nervous system and sense organs: Secondary | ICD-10-CM | POA: Insufficient documentation

## 2012-11-28 DIAGNOSIS — Y9389 Activity, other specified: Secondary | ICD-10-CM | POA: Insufficient documentation

## 2012-11-28 DIAGNOSIS — F17200 Nicotine dependence, unspecified, uncomplicated: Secondary | ICD-10-CM | POA: Insufficient documentation

## 2012-11-28 DIAGNOSIS — N949 Unspecified condition associated with female genital organs and menstrual cycle: Secondary | ICD-10-CM | POA: Insufficient documentation

## 2012-11-28 DIAGNOSIS — R102 Pelvic and perineal pain: Secondary | ICD-10-CM

## 2012-11-28 DIAGNOSIS — S3140XA Unspecified open wound of vagina and vulva, initial encounter: Secondary | ICD-10-CM | POA: Insufficient documentation

## 2012-11-28 LAB — URINE MICROSCOPIC-ADD ON

## 2012-11-28 LAB — URINALYSIS, ROUTINE W REFLEX MICROSCOPIC
Glucose, UA: NEGATIVE mg/dL
Leukocytes, UA: NEGATIVE
Protein, ur: NEGATIVE mg/dL
Specific Gravity, Urine: >1.03 — ABNORMAL HIGH (ref 1.005–1.030)
pH: 5.5 (ref 5.0–8.0)

## 2012-11-28 LAB — PREGNANCY, URINE: Preg Test, Ur: NEGATIVE

## 2012-11-28 MED ORDER — TRAMADOL HCL 50 MG PO TABS: 50.0000 mg | ORAL_TABLET | Freq: Four times a day (QID) | ORAL | Status: DC | PRN

## 2012-11-28 NOTE — ED Notes (Signed)
Pt reports that she found either a condom or tampon in her vagina, she is scared to pull it out, and is unsure how long it has been in her vagina, having ab pain and cramping, +nausea.

## 2012-11-28 NOTE — ED Provider Notes (Signed)
CSN: [id number]     Arrival date & time 11/28/12  1625 History   First MD Initiated Contact with Patient 11/28/12 1703     Chief Complaint  Patient presents with  . Foreign Body in Vagina   (Consider location/radiation/quality/duration/timing/severity/associated sxs/prior Treatment) HPI Comments: Patient presents because she thinks there is a foreign body in her vagina. Patient reports that she felt something inside her while cleaning herself. She thinks it's a condom. She said she couldn't grab it with her fingers and stretches but does not come out. Patient reports that she has been having increased pain and pelvic cramping said she noticed this. She has not had any fever.  Patient is a 27 y.o. female presenting with foreign body in vagina.  Foreign Body in Vagina    Past Medical History  Diagnosis Date  . Depression   . Seizures    Past Surgical History  Procedure Laterality Date  . Mouth surgery     No family history on file. History  Substance Use Topics  . Smoking status: Current Some Day Smoker    Types: Cigarettes  . Smokeless tobacco: Not on file  . Alcohol Use: Yes     Comment: occ   OB History   Grav Para Term Preterm Abortions TAB SAB Ect Mult Living                 Review of Systems  Genitourinary: Positive for pelvic pain.  All other systems reviewed and are negative.    Allergies  Review of patient's allergies indicates no known allergies.  Home Medications  No current outpatient prescriptions on file. BP 122/68  Pulse 60  Temp(Src) 97.9 F (36.6 C) (Oral)  Resp 20  Ht 5\' 3"  (1.6 m)  Wt 140 lb (63.504 kg)  BMI 24.81 kg/m2  SpO2 100%  LMP 11/19/2012 Physical Exam  Constitutional: She is oriented to person, place, and time. She appears well-developed and well-nourished. No distress.  HENT:  Head: Normocephalic and atraumatic.  Right Ear: Hearing normal.  Left Ear: Hearing normal.  Nose: Nose normal.  Mouth/Throat: Oropharynx is clear and  moist and mucous membranes are normal.  Eyes: Conjunctivae and EOM are normal. Pupils are equal, round, and reactive to light.  Neck: Normal range of motion. Neck supple.  Cardiovascular: Regular rhythm, S1 normal and S2 normal.  Exam reveals no gallop and no friction rub.   No murmur heard. Pulmonary/Chest: Effort normal and breath sounds normal. No respiratory distress. She exhibits no tenderness.  Abdominal: Soft. Normal appearance and bowel sounds are normal. There is no hepatosplenomegaly. There is no tenderness. There is no rebound, no guarding, no tenderness at McBurney's point and negative Murphy's sign. No hernia.  Genitourinary:     Musculoskeletal: Normal range of motion.  Neurological: She is alert and oriented to person, place, and time. She has normal strength. No cranial nerve deficit or sensory deficit. Coordination normal. GCS eye subscore is 4. GCS verbal subscore is 5. GCS motor subscore is 6.  Skin: Skin is warm, dry and intact. No rash noted. No cyanosis.  Psychiatric: Her speech is normal and behavior is normal. Thought content normal. Her mood appears anxious.    ED Course  Procedures (including critical care time) Labs Review Labs Reviewed  URINALYSIS, ROUTINE W REFLEX MICROSCOPIC  PREGNANCY, URINE   Imaging Review No results found.  MDM  Diagnosis: Vaginal laceration  Examination reveals that the patient has swelling and increased variegation of the tissue just posterior to  the urethra. She points to this area and actually has been pulling on the tissue thinking that it is a condom. The area is macerated and there is a small laceration that does not require repair. A speculum was placed into the vagina and thoroughly evaluated only to the cervix. No foreign bodies were noted. Patient was reassured, was told to stop pulling on the tissue and it will heal.   Gilda Crease, MD 11/28/12 [phone]

## 2013-07-04 ENCOUNTER — Emergency Department (HOSPITAL_COMMUNITY)
Admission: EM | Admit: 2013-07-04 | Discharge: 2013-07-04 | Disposition: A | Discharge status: Home / Self Care | Payer: Medicaid Other | Attending: Emergency Medicine | Admitting: Emergency Medicine

## 2013-07-04 ENCOUNTER — Encounter (HOSPITAL_COMMUNITY): Payer: Self-pay | Admitting: Emergency Medicine

## 2013-07-04 DIAGNOSIS — K047 Periapical abscess without sinus: Secondary | ICD-10-CM | POA: Insufficient documentation

## 2013-07-04 DIAGNOSIS — F329 Major depressive disorder, single episode, unspecified: Secondary | ICD-10-CM | POA: Insufficient documentation

## 2013-07-04 DIAGNOSIS — K029 Dental caries, unspecified: Secondary | ICD-10-CM | POA: Insufficient documentation

## 2013-07-04 DIAGNOSIS — F3289 Other specified depressive episodes: Secondary | ICD-10-CM | POA: Insufficient documentation

## 2013-07-04 DIAGNOSIS — Z9889 Other specified postprocedural states: Secondary | ICD-10-CM | POA: Insufficient documentation

## 2013-07-04 DIAGNOSIS — G40909 Epilepsy, unspecified, not intractable, without status epilepticus: Secondary | ICD-10-CM | POA: Insufficient documentation

## 2013-07-04 DIAGNOSIS — F17200 Nicotine dependence, unspecified, uncomplicated: Secondary | ICD-10-CM | POA: Insufficient documentation

## 2013-07-04 DIAGNOSIS — Z79899 Other long term (current) drug therapy: Secondary | ICD-10-CM | POA: Insufficient documentation

## 2013-07-04 MED ORDER — TRAMADOL HCL 50 MG PO TABS
50.0000 mg | ORAL_TABLET | Freq: Once | ORAL | Status: AC
  Administered 2013-07-04: 50 mg via ORAL
  Filled 2013-07-04: qty 1

## 2013-07-04 MED ORDER — TRAMADOL HCL 50 MG PO TABS: 50.0000 mg | ORAL_TABLET | Freq: Four times a day (QID) | ORAL | Status: DC | PRN

## 2013-07-04 MED ORDER — AMOXICILLIN 500 MG PO CAPS: 500.0000 mg | ORAL_CAPSULE | Freq: Three times a day (TID) | ORAL | Status: AC

## 2013-07-04 MED ORDER — IBUPROFEN 600 MG PO TABS: 600.0000 mg | ORAL_TABLET | Freq: Four times a day (QID) | ORAL | Status: DC | PRN

## 2013-07-04 MED ORDER — AMOXICILLIN 250 MG PO CAPS
500.0000 mg | ORAL_CAPSULE | Freq: Once | ORAL | Status: AC
  Administered 2013-07-04: 500 mg via ORAL
  Filled 2013-07-04: qty 2

## 2013-07-04 NOTE — Care Management Note (Signed)
ED/CM noted patient did not have health insurance and/or PCP listed in the computer.  Patient was given the [facility] with information on the clinics, food pantries, and the handout for new health insurance sign-up.  Patient expressed appreciation for information received. Pt was also given a prescription assistance card.

## 2013-07-04 NOTE — ED Notes (Signed)
Pt alert & oriented x4, stable gait. Patient given discharge instructions, paperwork & prescription(s). Patient  instructed to stop at the registration desk to finish any additional paperwork. Patient verbalized understanding. Pt left department w/ no further questions. 

## 2013-07-04 NOTE — ED Provider Notes (Signed)
Medical screening examination/treatment/procedure(s) were performed by non-physician practitioner and as supervising physician I was immediately available for consultation/collaboration.   EKG Interpretation None      Rolland Porter, MD, Abram Sander   Janice Norrie, MD 07/04/13 [phone]

## 2013-07-04 NOTE — ED Notes (Signed)
Pt reports upper lt pain in rear teeth, gums appear red and swollen

## 2013-07-04 NOTE — Discharge Instructions (Signed)
Abscessed Tooth An abscessed tooth is an infection around your tooth. It may be caused by holes or damage to the tooth (cavity) or a dental disease. An abscessed tooth causes mild to very bad pain in and around the tooth. See your dentist right away if you have tooth or gum pain. HOME CARE  Take your medicine as told. Finish it even if you start to feel better.  Do not drive after taking pain medicine.  Rinse your mouth (gargle) often with salt water ( teaspoon salt in 8 ounces of warm water).  Do not apply heat to the outside of your face. GET HELP RIGHT AWAY IF:   You have a temperature by mouth above 102 F (38.9 C), not controlled by medicine.  You have chills and a very bad headache.  You have problems breathing or swallowing.  Your mouth will not open.  You develop puffiness (swelling) on the neck or around the eye.  Your pain is not helped by medicine.  Your pain is getting worse instead of better. MAKE SURE YOU:   Understand these instructions.  Will watch your condition.  Will get help right away if you are not doing well or get worse. Document Released: 08/31/2007 Document Revised: 06/06/2011 Document Reviewed: 06/22/2010 [facility] Patient Information 2014 [place].  Complete your entire course of antibiotics as prescribed.  You  may use the ultram for pain relief but do not drive within 4 hours of taking as this will make you drowsy.  Avoid applying heat or ice to this abscess area which can worsen your symptoms.  You may use warm salt water swish and spit treatment or half peroxide and water swish and spit after meals to keep this area clean as discussed.  Call the dentist listed above for further management of your symptoms.

## 2013-07-04 NOTE — ED Provider Notes (Signed)
CSN: [id number]     Arrival date & time 07/04/13  1042 History   First MD Initiated Contact with Patient 07/04/13 1138     Chief Complaint  Patient presents with  . Dental Pain     (Consider location/radiation/quality/duration/timing/severity/associated sxs/prior Treatment) HPI Comments: Mallory Mitchell is a 28 y.o. Female presenting with a one day history of dental pain and gingival swelling.   The patient has a history of  decay in the tooth involved which has recently started to cause increased  pain and now swelling of the gingival line.  There has been no fevers, chills, nausea or vomiting, also no complaint of difficulty swallowing, although chewing makes pain worse.  The patient has tried [facility] powders and ibuprofen without relief of symptoms.         The history is provided by the patient.    Past Medical History  Diagnosis Date  . Depression   . Seizures    Past Surgical History  Procedure Laterality Date  . Mouth surgery     History reviewed. No pertinent family history. History  Substance Use Topics  . Smoking status: Current Some Day Smoker    Types: Cigarettes  . Smokeless tobacco: Not on file  . Alcohol Use: Yes     Comment: occ   OB History   Grav Para Term Preterm Abortions TAB SAB Ect Mult Living                 Review of Systems  Constitutional: Negative for fever.  HENT: Positive for dental problem. Negative for facial swelling and sore throat.   Respiratory: Negative for shortness of breath.   Musculoskeletal: Negative for neck pain and neck stiffness.      Allergies  Review of patient's allergies indicates no known allergies.  Home Medications   Current Outpatient Rx  Name  Route  Sig  Dispense  Refill  . Aspirin-Salicylamide-Caffeine (BC HEADACHE POWDER PO)   Oral   Take 1 Package by mouth every 4 (four) hours as needed (pain).         Marland Kitchen divalproex (DEPAKOTE) 500 MG DR tablet   Oral   Take 500 mg by mouth 2 (two) times daily.        Marland Kitchen ibuprofen (ADVIL,MOTRIN) 200 MG tablet   Oral   Take 200 mg by mouth daily as needed for moderate pain.         Marland Kitchen amoxicillin (AMOXIL) 500 MG capsule   Oral   Take 1 capsule (500 mg total) by mouth 3 (three) times daily.   30 capsule   0   . ibuprofen (ADVIL,MOTRIN) 600 MG tablet   Oral   Take 1 tablet (600 mg total) by mouth every 6 (six) hours as needed.   30 tablet   0   . traMADol (ULTRAM) 50 MG tablet   Oral   Take 1 tablet (50 mg total) by mouth every 6 (six) hours as needed.   20 tablet   0    BP 123/83  Pulse 81  Temp(Src) 98.8 F (37.1 C) (Oral)  Resp 18  Ht 5\' 3"  (1.6 m)  Wt 148 lb (67.132 kg)  BMI 26.22 kg/m2  SpO2 98%  LMP 06/22/2013 Physical Exam  Constitutional: She is oriented to person, place, and time. She appears well-developed and well-nourished. No distress.  HENT:  Head: Normocephalic and atraumatic.  Right Ear: Tympanic membrane and external ear normal.  Left Ear: Tympanic membrane and external ear normal.  Mouth/Throat: Oropharynx is clear and moist and mucous membranes are normal. No oral lesions. No trismus in the jaw. Dental abscesses present.    Eyes: Conjunctivae are normal.  Neck: Normal range of motion. Neck supple.  Cardiovascular: Normal rate and normal heart sounds.   Pulmonary/Chest: Effort normal.  Abdominal: She exhibits no distension.  Musculoskeletal: Normal range of motion.  Lymphadenopathy:    She has no cervical adenopathy.  Neurological: She is alert and oriented to person, place, and time.  Skin: Skin is warm and dry. No erythema.  Psychiatric: She has a normal mood and affect.    ED Course  Procedures (including critical care time) Labs Review Labs Reviewed - No data to display Imaging Review No results found.   EKG Interpretation None      MDM   Final diagnoses:  Dental abscess    Dental referrals were given.  She was prescribed amoxicillin, Ultram for pain relief, prescription strength  ibuprofen.  The patient appears reasonably screened and/or stabilized for discharge and I doubt any other medical condition or other [facility] requiring further screening, evaluation, or treatment in the ED at this time prior to discharge.     Evalee Jefferson, PA-C 07/04/13 1158

## 2013-12-06 DIAGNOSIS — G40909 Epilepsy, unspecified, not intractable, without status epilepticus: Secondary | ICD-10-CM | POA: Insufficient documentation

## 2013-12-06 DIAGNOSIS — Z6791 Unspecified blood type, Rh negative: Secondary | ICD-10-CM | POA: Insufficient documentation

## 2013-12-06 DIAGNOSIS — O26892 Other specified pregnancy related conditions, second trimester: Secondary | ICD-10-CM | POA: Insufficient documentation

## 2014-03-02 DIAGNOSIS — O42012 Preterm premature rupture of membranes, onset of labor within 24 hours of rupture, second trimester: Secondary | ICD-10-CM | POA: Insufficient documentation

## 2014-03-02 DIAGNOSIS — O4290 Premature rupture of membranes, unspecified as to length of time between rupture and onset of labor, unspecified weeks of gestation: Secondary | ICD-10-CM | POA: Insufficient documentation

## 2014-03-17 DIAGNOSIS — F1120 Opioid dependence, uncomplicated: Secondary | ICD-10-CM | POA: Insufficient documentation

## 2015-04-22 ENCOUNTER — Emergency Department (HOSPITAL_COMMUNITY)
Admission: EM | Admit: 2015-04-22 | Discharge: 2015-04-22 | Disposition: A | Discharge status: Home / Self Care | Payer: Medicaid Other | Attending: Emergency Medicine | Admitting: Emergency Medicine

## 2015-04-22 ENCOUNTER — Encounter (HOSPITAL_COMMUNITY): Payer: Self-pay | Admitting: Emergency Medicine

## 2015-04-22 DIAGNOSIS — F17210 Nicotine dependence, cigarettes, uncomplicated: Secondary | ICD-10-CM | POA: Insufficient documentation

## 2015-04-22 DIAGNOSIS — F329 Major depressive disorder, single episode, unspecified: Secondary | ICD-10-CM | POA: Insufficient documentation

## 2015-04-22 DIAGNOSIS — K029 Dental caries, unspecified: Secondary | ICD-10-CM | POA: Insufficient documentation

## 2015-04-22 DIAGNOSIS — Z79899 Other long term (current) drug therapy: Secondary | ICD-10-CM | POA: Diagnosis not present

## 2015-04-22 DIAGNOSIS — G8929 Other chronic pain: Secondary | ICD-10-CM | POA: Insufficient documentation

## 2015-04-22 DIAGNOSIS — K0889 Other specified disorders of teeth and supporting structures: Secondary | ICD-10-CM | POA: Insufficient documentation

## 2015-04-22 MED ORDER — AMOXICILLIN 250 MG PO CAPS
500.0000 mg | ORAL_CAPSULE | Freq: Once | ORAL | Status: AC
  Administered 2015-04-22: 500 mg via ORAL
  Filled 2015-04-22: qty 2

## 2015-04-22 MED ORDER — TRAMADOL HCL 50 MG PO TABS: 50.0000 mg | ORAL_TABLET | Freq: Four times a day (QID) | ORAL | Status: DC | PRN

## 2015-04-22 MED ORDER — PROMETHAZINE HCL 12.5 MG PO TABS
12.5000 mg | ORAL_TABLET | Freq: Once | ORAL | Status: AC
  Administered 2015-04-22: 12.5 mg via ORAL
  Filled 2015-04-22: qty 1

## 2015-04-22 MED ORDER — IBUPROFEN 800 MG PO TABS
800.0000 mg | ORAL_TABLET | Freq: Once | ORAL | Status: DC
  Filled 2015-04-22: qty 1

## 2015-04-22 MED ORDER — TRAMADOL HCL 50 MG PO TABS
50.0000 mg | ORAL_TABLET | Freq: Once | ORAL | Status: AC
  Administered 2015-04-22: 50 mg via ORAL
  Filled 2015-04-22: qty 1

## 2015-04-22 MED ORDER — AMOXICILLIN 500 MG PO CAPS: 500.0000 mg | ORAL_CAPSULE | Freq: Three times a day (TID) | ORAL | Status: DC

## 2015-04-22 NOTE — Discharge Instructions (Signed)
Please ukse amoxil and ibuprofen three times daily. Use ultram every 6 hours as needed for more severe pain. See your dentist as soon as possible.

## 2015-04-22 NOTE — ED Provider Notes (Signed)
CSN: [id number]     Arrival date & time 04/22/15  1000 History   First MD Initiated Contact with Patient 04/22/15 1027     Chief Complaint  Patient presents with  . Dental Pain     (Consider location/radiation/quality/duration/timing/severity/associated sxs/prior Treatment) Patient is a 30 y.o. female presenting with tooth pain. The history is provided by the patient.  Dental Pain Location:  Upper Severity:  Moderate Onset quality:  Gradual Duration:  1 day Timing:  Intermittent Progression:  Worsening Chronicity:  Chronic Context: dental caries and poor dentition   Relieved by:  Nothing Worsened by:  Nothing tried Associated symptoms: no fever and no trismus   Risk factors: lack of dental care and smoking     Past Medical History  Diagnosis Date  . Depression   . Seizures [facility])    Past Surgical History  Procedure Laterality Date  . Mouth surgery     No family history on file. Social History  Substance Use Topics  . Smoking status: Current Some Day Smoker -- 0.50 packs/day    Types: Cigarettes  . Smokeless tobacco: None  . Alcohol Use: Yes     Comment: occ   OB History    No data available     Review of Systems  Constitutional: Negative for fever.  HENT: Positive for dental problem.   Psychiatric/Behavioral:       Deprssion  All other systems reviewed and are negative.     Allergies  Review of patient's allergies indicates no known allergies.  Home Medications   Prior to Admission medications   Medication Sig Start Date End Date Taking? Authorizing Provider  Aspirin-Salicylamide-Caffeine (BC FAST PAIN RELIEF) [phone] MG PACK Take 1 Package by mouth as needed.    Yes Historical Provider, MD  divalproex (DEPAKOTE) 500 MG DR tablet Take 500 mg by mouth 2 (two) times daily.   Yes Historical Provider, MD  ibuprofen (ADVIL,MOTRIN) 800 MG tablet Take 800 mg by mouth every 8 (eight) hours as needed.   Yes Historical Provider, MD  ibuprofen  (ADVIL,MOTRIN) 600 MG tablet Take 1 tablet (600 mg total) by mouth every 6 (six) hours as needed. Patient not taking: Reported on 04/22/2015 07/04/13   Evalee Jefferson, PA-C  traMADol (ULTRAM) 50 MG tablet Take 1 tablet (50 mg total) by mouth every 6 (six) hours as needed. Patient not taking: Reported on 04/22/2015 07/04/13   Evalee Jefferson, PA-C   BP 116/85 mmHg  Pulse 67  Ht 5\' 3"  (1.6 m)  Wt 58.968 kg  BMI 23.03 kg/m2  SpO2 94%  LMP 04/20/2015 Physical Exam  Constitutional: She is oriented to person, place, and time. She appears well-developed and well-nourished.  Non-toxic appearance.  HENT:  Head: Normocephalic.  Right Ear: Tympanic membrane and external ear normal.  Left Ear: Tympanic membrane and external ear normal.  Multiple dental caries present. No visible abscess. Airway patent. No swelling under the tongue.  Eyes: EOM and lids are normal. Pupils are equal, round, and reactive to light.  Neck: Normal range of motion. Neck supple. Carotid bruit is not present.  Cardiovascular: Normal rate, regular rhythm, normal heart sounds, intact distal pulses and normal pulses.   Pulmonary/Chest: Breath sounds normal. No respiratory distress.  Abdominal: Soft. Bowel sounds are normal. There is no tenderness. There is no guarding.  Musculoskeletal: Normal range of motion.  Lymphadenopathy:       Head (right side): No submandibular adenopathy present.       Head (left side): No submandibular  adenopathy present.    She has no cervical adenopathy.  Neurological: She is alert and oriented to person, place, and time. She has normal strength. No cranial nerve deficit or sensory deficit.  Skin: Skin is warm and dry.  Psychiatric: She has a normal mood and affect. Her speech is normal.  Nursing note and vitals reviewed.   ED Course  Procedures (including critical care time) Labs Review Labs Reviewed - No data to display  Imaging Review No results found. I have personally reviewed and evaluated these  images and lab results as part of my medical decision-making.   EKG Interpretation None      MDM  Discuss the need to see a dentist as soon as possible. No evidence for Ludwig's angina or other dental emergency at this time. Rx for amoxil and Ultram given to the patient. Pt to use ibuprofen three times daily with food.   Final diagnoses:  Pain, dental    *I have reviewed nursing notes, vital signs, and all appropriate lab and imaging results for this patient.[address] PA-C 04/22/15 1125  Milton Ferguson, MD 04/24/15 [phone]

## 2015-04-22 NOTE — ED Notes (Signed)
Pt reports LT upper jaw pain that began yesterday. Pt states she has been taking BC powders with no relief. Denies n/v/d. Denies fever/chills. Airway patent.

## 2015-12-08 ENCOUNTER — Encounter (HOSPITAL_COMMUNITY): Payer: Self-pay | Admitting: Emergency Medicine

## 2015-12-08 ENCOUNTER — Emergency Department (HOSPITAL_COMMUNITY)
Admission: EM | Admit: 2015-12-08 | Discharge: 2015-12-08 | Disposition: A | Discharge status: Home / Self Care | Payer: Medicaid Other | Attending: Emergency Medicine | Admitting: Emergency Medicine

## 2015-12-08 DIAGNOSIS — Y9389 Activity, other specified: Secondary | ICD-10-CM | POA: Insufficient documentation

## 2015-12-08 DIAGNOSIS — Y999 Unspecified external cause status: Secondary | ICD-10-CM | POA: Diagnosis not present

## 2015-12-08 DIAGNOSIS — Y929 Unspecified place or not applicable: Secondary | ICD-10-CM | POA: Diagnosis not present

## 2015-12-08 DIAGNOSIS — S61213A Laceration without foreign body of left middle finger without damage to nail, initial encounter: Secondary | ICD-10-CM | POA: Insufficient documentation

## 2015-12-08 DIAGNOSIS — S61219A Laceration without foreign body of unspecified finger without damage to nail, initial encounter: Secondary | ICD-10-CM

## 2015-12-08 DIAGNOSIS — F17210 Nicotine dependence, cigarettes, uncomplicated: Secondary | ICD-10-CM | POA: Diagnosis not present

## 2015-12-08 DIAGNOSIS — W260XXA Contact with knife, initial encounter: Secondary | ICD-10-CM | POA: Diagnosis not present

## 2015-12-08 DIAGNOSIS — Z7982 Long term (current) use of aspirin: Secondary | ICD-10-CM | POA: Insufficient documentation

## 2015-12-08 MED ORDER — AMOXICILLIN-POT CLAVULANATE 875-125 MG PO TABS: 1.0000 | ORAL_TABLET | Freq: Two times a day (BID) | ORAL | 0 refills | Status: DC

## 2015-12-08 MED ORDER — BACITRACIN ZINC 500 UNIT/GM EX OINT
TOPICAL_OINTMENT | Freq: Once | CUTANEOUS | Status: AC
  Administered 2015-12-08: 11:00:00 via TOPICAL

## 2015-12-08 NOTE — ED Notes (Signed)
Laceration to left middle finger with kitchen knife, last night around 8pm. Bleeding controlled at this time.

## 2015-12-08 NOTE — ED Provider Notes (Signed)
[facility] DEPT Provider Note   CSN: [id number] Arrival date & time: 12/08/15  [id number]  By signing my name below, I, Rayna Sexton, attest that this documentation has been prepared under the direction and in the presence of Sumner Boesch Camprubi-Soms, PA-C. Electronically Signed: Rayna Sexton, ED Scribe. 12/08/15. 10:31 AM.   History   Chief Complaint Chief Complaint  Patient presents with  . Laceration    LEFT INDEX FINGER    HPI HPI Comments: Mallory Mitchell is a 30 y.o. female who presents to the Emergency Department complaining of a finger laceration with controlled bleeding to the distal end of her left middle finger which occurred last night around 8:00 PM. Pt accidentally cut the finger on a kitchen knife last night and was unable to come to the ED for evaluation until this morning. She reports associated, worsening 9/10 constant, throbbing, non-radiating, pain across the wound. Her pain worsens with finger movement and palpation of the region and alleviates slightly with elevation. She has taken tylenol, 800 mg ibuprofen, and BC powders without significant relief. She is not on anticoagulants and has no bleeding disorders. Bleeding has oozed since last night but stops unless she moves her finger. Her last tetanus vaccination was 6 months ago. She denies numbness, tingling, decreased ROM, weakness, swelling,  or other associated symptoms at this time.   The history is provided by the patient. No language interpreter was used.  Laceration   The incident occurred 12 to 24 hours ago. The laceration is located on the left hand. The laceration mechanism was a a clean knife. The pain is at a severity of 9/10. The pain is moderate. The pain has been constant since onset. She reports no foreign bodies present. Her tetanus status is UTD.   Past Medical History:  Diagnosis Date  . Depression   . Seizures ([place])     There are no active problems to display for this patient.   Past Surgical  History:  Procedure Laterality Date  . MOUTH SURGERY      OB History    No data available     Home Medications    Prior to Admission medications   Medication Sig Start Date End Date Taking? Authorizing Provider  amoxicillin (AMOXIL) 500 MG capsule Take 1 capsule (500 mg total) by mouth 3 (three) times daily. 04/22/15   Lily Kocher, PA-C  Aspirin-Salicylamide-Caffeine (BC FAST PAIN RELIEF) [phone] MG PACK Take 1 Package by mouth as needed.     Historical Provider, MD  divalproex (DEPAKOTE) 500 MG DR tablet Take 500 mg by mouth 2 (two) times daily.    Historical Provider, MD  ibuprofen (ADVIL,MOTRIN) 600 MG tablet Take 1 tablet (600 mg total) by mouth every 6 (six) hours as needed. Patient not taking: Reported on 04/22/2015 07/04/13   Evalee Jefferson, PA-C  ibuprofen (ADVIL,MOTRIN) 800 MG tablet Take 800 mg by mouth every 8 (eight) hours as needed.    Historical Provider, MD  traMADol (ULTRAM) 50 MG tablet Take 1 tablet (50 mg total) by mouth every 6 (six) hours as needed. 04/22/15   Lily Kocher, PA-C    Family History No family history on file.  Social History Social History  Substance Use Topics  . Smoking status: Current Some Day Smoker    Packs/day: 0.50    Types: Cigarettes  . Smokeless tobacco: Never Used  . Alcohol use Yes     Comment: occ   Allergies   Review of patient's allergies indicates no known allergies.  Review of Systems Review of Systems  Musculoskeletal: Positive for arthralgias. Negative for joint swelling and myalgias.  Skin: Positive for wound. Negative for color change.  Allergic/Immunologic: Negative for immunocompromised state.  Neurological: Negative for weakness and numbness.  Hematological: Does not bruise/bleed easily.    Physical Exam Updated Vital Signs BP 129/84 (BP Location: Right Arm)   Pulse 77   Temp 98 F (36.7 C) (Oral)   Resp 20   Ht 5\' 3"  (1.6 m)   Wt 130 lb (59 kg)   LMP 12/01/2015   SpO2 100%   BMI 23.03 kg/m    Physical Exam  Constitutional: She is oriented to person, place, and time. Vital signs are normal. She appears well-developed and well-nourished.  Non-toxic appearance. No distress.  Afebrile, nontoxic, NAD  HENT:  Head: Normocephalic and atraumatic.  Mouth/Throat: Mucous membranes are normal.  Eyes: Conjunctivae and EOM are normal. Right eye exhibits no discharge. Left eye exhibits no discharge.  Neck: Normal range of motion. Neck supple.  Cardiovascular: Normal rate and intact distal pulses.   Pulmonary/Chest: Effort normal. No respiratory distress.  Abdominal: Normal appearance. She exhibits no distension.  Musculoskeletal: Normal range of motion.       Left hand: She exhibits tenderness and laceration. She exhibits normal range of motion, no bony tenderness, normal two-point discrimination, normal capillary refill, no deformity and no swelling. Normal sensation noted. Normal strength noted.  Left middle finger with FROM intact in all joints, no swelling or erythema, no warmth, with very small superficial linear laceration across the PIP joint, no ongoing bleeding, no drainage or retained FBs, with minimal TTP over the laceration but no focal bony TTP of the finger or hand, strength and sensation grossly intact, distal pulses intact, no deformities, compartments soft. SEE PICTURE BELOW  Neurological: She is alert and oriented to person, place, and time. She has normal strength. No sensory deficit.  Skin: Skin is warm and dry. Laceration noted. No rash noted.  Left middle finger lac as noted above and picture below.   Psychiatric: She has a normal mood and affect. Her behavior is normal.  Nursing note and vitals reviewed.   ED Treatments / Results  Labs (all labs ordered are listed, but only abnormal results are displayed) Labs Reviewed - No data to display  EKG  EKG Interpretation None       Radiology No results found.  Procedures Procedures  DIAGNOSTIC STUDIES: Oxygen  Saturation is 100% on RA, normal by my interpretation.    COORDINATION OF CARE: 10:26 AM Discussed next steps with pt. Pt verbalized understanding and is agreeable with the plan.    Medications Ordered in ED Medications  bacitracin ointment (not administered)     Initial Impression / Assessment and Plan / ED Course  I have reviewed the triage vital signs and the nursing notes.  Pertinent labs & imaging results that were available during my care of the patient were reviewed by me and considered in my medical decision making (see chart for details).  Clinical Course    30 y.o. female here with very superficial L middle finger laceration sustained ~15hrs ago, no ongoing bleeding, already appears to be healing on its own. No focal bony TTP, doubt underlying bony injury; no FBs retained in the wound. NVI with soft compartments. Doubt need for wound closure given how small/superficial it is, and how long it's been since the injury occurred. Will have to heal on its own. Discussed proper wound care. Will give abx  to cover for infection to ensure no infection occurs. Tdap up to date. F/up with PCP in 3 days for wound recheck. I explained the diagnosis and have given explicit precautions to return to the ER including for any other new or worsening symptoms. The patient understands and accepts the medical plan as it's been dictated and I have answered their questions. Discharge instructions concerning home care and prescriptions have been given. The patient is STABLE and is discharged to home in good condition.   I personally performed the services described in this documentation, which was scribed in my presence. The recorded information has been reviewed and is accurate.    Final Clinical Impressions(s) / ED Diagnoses   Final diagnoses:  Laceration of finger, initial encounter    New Prescriptions New Prescriptions   AMOXICILLIN-CLAVULANATE (AUGMENTIN) 875-125 MG TABLET    Take 1 tablet by  mouth 2 (two) times daily. One po bid x 7 days     [employer], PA-C 12/08/15 1037    Nat Christen, MD 12/09/15 2124

## 2015-12-08 NOTE — ED Notes (Signed)
Pt given a few supplies for wound care.

## 2015-12-08 NOTE — Discharge Instructions (Signed)
Keep wound clean with mild soap and water. Keep area covered with a topical antibiotic ointment and bandage, keep bandage dry, and try to not bend the finger too much in order to allow the laceration to heal. Take antibiotic as directed to prevent infection. Ice and elevate for additional pain relief and swelling. Alternate between Ibuprofen and Tylenol for additional pain relief. Follow up with your primary care doctor listed on your medicaid card, or the [facility] in approximately 2-3 days for wound recheck. Monitor area for signs of infection to include, but not limited to: increasing pain, spreading redness, drainage/pus, worsening swelling, or fevers. Return to emergency department for emergent changing or worsening symptoms.

## 2017-06-13 DIAGNOSIS — Z3689 Encounter for other specified antenatal screening: Secondary | ICD-10-CM | POA: Insufficient documentation

## 2018-12-20 ENCOUNTER — Other Ambulatory Visit: Payer: Self-pay

## 2018-12-20 ENCOUNTER — Emergency Department (HOSPITAL_COMMUNITY)
Admission: EM | Admit: 2018-12-20 | Discharge: 2018-12-20 | Disposition: A | Discharge status: Home / Self Care | Payer: Medicaid Other | Attending: Emergency Medicine | Admitting: Emergency Medicine

## 2018-12-20 ENCOUNTER — Emergency Department (HOSPITAL_COMMUNITY): Payer: Medicaid Other

## 2018-12-20 ENCOUNTER — Encounter (HOSPITAL_COMMUNITY): Payer: Self-pay | Admitting: Emergency Medicine

## 2018-12-20 DIAGNOSIS — M545 Low back pain: Secondary | ICD-10-CM | POA: Diagnosis present

## 2018-12-20 DIAGNOSIS — M5441 Lumbago with sciatica, right side: Secondary | ICD-10-CM | POA: Insufficient documentation

## 2018-12-20 DIAGNOSIS — Z79899 Other long term (current) drug therapy: Secondary | ICD-10-CM | POA: Insufficient documentation

## 2018-12-20 DIAGNOSIS — F17210 Nicotine dependence, cigarettes, uncomplicated: Secondary | ICD-10-CM | POA: Insufficient documentation

## 2018-12-20 DIAGNOSIS — M5440 Lumbago with sciatica, unspecified side: Secondary | ICD-10-CM

## 2018-12-20 HISTORY — DX: Myasthenia gravis without (acute) exacerbation: G70.00

## 2018-12-20 LAB — POC URINE PREG, ED: Preg Test, Ur: NEGATIVE

## 2018-12-20 MED ORDER — ACETAMINOPHEN 500 MG PO TABS
1000.0000 mg | ORAL_TABLET | Freq: Once | ORAL | Status: AC
  Administered 2018-12-20: 1000 mg via ORAL
  Filled 2018-12-20: qty 2

## 2018-12-20 MED ORDER — DIAZEPAM 5 MG PO TABS
5.0000 mg | ORAL_TABLET | Freq: Once | ORAL | Status: AC
  Administered 2018-12-20: 5 mg via ORAL
  Filled 2018-12-20: qty 1

## 2018-12-20 MED ORDER — KETOROLAC TROMETHAMINE 30 MG/ML IJ SOLN
15.0000 mg | Freq: Once | INTRAMUSCULAR | Status: AC
  Administered 2018-12-20: 15 mg via INTRAMUSCULAR
  Filled 2018-12-20: qty 1

## 2018-12-20 MED ORDER — CYCLOBENZAPRINE HCL 10 MG PO TABS: 10.0000 mg | ORAL_TABLET | Freq: Two times a day (BID) | ORAL | 0 refills | Status: DC

## 2018-12-20 NOTE — ED Provider Notes (Signed)
[facility] EMERGENCY DEPARTMENT Provider Note   CSN: [id number] Arrival date & time: 12/20/18  1257     History   Chief Complaint Chief Complaint  Patient presents with  . Leg Pain    HPI Mallory Mitchell is a 33 y.o. female.     33 y.o female with a PMH of Myasthenia gravis, Seizures presents to the ED via EMS with a chief complaint of low back pain x yesterday. Patient reports she was sweeping the floor two days ago when she fell landing on the right side of her hip hip. She reports taking some gabapentin with no improvement.  Reports yesterday she had back in the whole house, increase in activity which made her right leg pain worse, she describes as a pressure sensation to her right buttocks with radiation down the front of her right leg.  She reports this pain is worse with movement, worse with ambulation.  Patient reports her friend gave her some medication for improvement states she took "Ritalin to increase her energy ".  Patient reports waking up from a nap today with worsening pain to her right leg.  Denies any history of fever, IV drug use, cancer.  No urinary retention, bowel incontinence.  Has urinated in the ED.  The history is provided by the patient and medical records.  Leg Pain Location:  Hip Time since incident:  2 days Hip location:  R hip Associated symptoms: back pain   Associated symptoms: no fever     Past Medical History:  Diagnosis Date  . Depression   . Myasthenia gravis ([place])   . Seizures ([place])     There are no active problems to display for this patient.   Past Surgical History:  Procedure Laterality Date  . MOUTH SURGERY       OB History   No obstetric history on file.      Home Medications    Prior to Admission medications   Medication Sig Start Date End Date Taking? Authorizing Provider  amoxicillin (AMOXIL) 500 MG capsule Take 1 capsule (500 mg total) by mouth 3 (three) times daily. 04/22/15   Lily Kocher, PA-C   amoxicillin-clavulanate (AUGMENTIN) 875-125 MG tablet Take 1 tablet by mouth 2 (two) times daily. One po bid x 7 days 12/08/15   Street, M[place], PA-C  Aspirin-Salicylamide-Caffeine (BC FAST PAIN RELIEF) [phone] MG PACK Take 1 Package by mouth as needed.     [provider]  cyclobenzaprine (FLEXERIL) 10 MG tablet Take 1 tablet (10 mg total) by mouth 2 (two) times daily for 7 days. 12/20/18 12/27/18  Janeece Fitting, PA-C  divalproex (DEPAKOTE) 500 MG DR tablet Take 500 mg by mouth 2 (two) times daily.    [provider]  ibuprofen (ADVIL,MOTRIN) 600 MG tablet Take 1 tablet (600 mg total) by mouth every 6 (six) hours as needed. Patient not taking: Reported on 04/22/2015 07/04/13   Evalee Jefferson, PA-C  ibuprofen (ADVIL,MOTRIN) 800 MG tablet Take 800 mg by mouth every 8 (eight) hours as needed.    [provider]  traMADol (ULTRAM) 50 MG tablet Take 1 tablet (50 mg total) by mouth every 6 (six) hours as needed. 04/22/15   Lily Kocher, PA-C    Family History No family history on file.  Social History Social History   Tobacco Use  . Smoking status: Current Some Day Smoker    Packs/day: 0.50    Types: Cigarettes  . Smokeless tobacco: Never Used  Substance Use Topics  .  Alcohol use: Yes    Comment: occ  . Drug use: Yes    Types: Cocaine, Marijuana    Comment: Denies     Allergies   Patient has no known allergies.   Review of Systems Review of Systems  Constitutional: Negative for fever.  HENT: Negative for sinus pressure.   Eyes: Negative for photophobia.  Respiratory: Negative for shortness of breath.   Cardiovascular: Negative for chest pain.  Gastrointestinal: Negative for abdominal pain, nausea and vomiting.  Genitourinary: Negative for flank pain.  Musculoskeletal: Positive for arthralgias, back pain and myalgias.  Skin: Negative for pallor and wound.  Neurological: Negative for weakness, light-headedness and headaches.     Physical  Exam Updated Vital Signs BP (!) 162/82 (BP Location: Left Arm)   Pulse 84   Temp 98.7 F (37.1 C) (Oral)   Resp 18   Ht 5\' 4"  (1.626 m)   Wt 83.9 kg   SpO2 100%   BMI 31.76 kg/m   Physical Exam Vitals signs and nursing note reviewed.  Constitutional:      General: She is not in acute distress.    Appearance: She is well-developed.     Comments: Teary-eyed on exam.  HENT:     Head: Normocephalic and atraumatic.     Mouth/Throat:     Pharynx: No oropharyngeal exudate.  Eyes:     Pupils: Pupils are equal, round, and reactive to light.  Neck:     Musculoskeletal: Normal range of motion.  Cardiovascular:     Rate and Rhythm: Regular rhythm.     Heart sounds: Normal heart sounds.  Pulmonary:     Effort: Pulmonary effort is normal. No respiratory distress.     Breath sounds: Normal breath sounds.  Abdominal:     General: Bowel sounds are normal. There is no distension.     Palpations: Abdomen is soft.     Tenderness: There is no abdominal tenderness.  Musculoskeletal:        General: No deformity.     Right hip: She exhibits decreased strength and tenderness.     Right lower leg: No edema.     Left lower leg: No edema.       Legs:     Comments: RLE- KF,KE 5/5 strength LLE- HF, HE 5/5 strength Normal gait. No pronator drift. No leg drop.  Patellar reflexes present and symmetric. CN I, II and VIII not tested. CN II-XII grossly intact bilaterally.     Skin:    General: Skin is warm and dry.  Neurological:     Mental Status: She is alert and oriented to person, place, and time.      ED Treatments / Results  Labs (all labs ordered are listed, but only abnormal results are displayed) Labs Reviewed  POC URINE PREG, ED    EKG None  Radiology Dg Lumbar Spine Complete  Result Date: 12/20/2018 CLINICAL DATA:  Low back pain with right-sided sciatica. Recent fall EXAM: LUMBAR SPINE - COMPLETE 4+ VIEW COMPARISON:  None. FINDINGS: Frontal, lateral, spot lumbosacral  lateral, and bilateral oblique views were obtained. There are 5 non-rib-bearing lumbar type vertebral bodies. There is no fracture or spondylolisthesis. There is moderate disc space narrowing at L3-4, L4-5, and L5-S1. There is no appreciable facet arthropathy. IMPRESSION: Disc space narrowing at L3-4, L4-5, and L5-S1. No fracture or spondylolisthesis. Electronically Signed   By: Lowella Grip III M.D.   On: 12/20/2018 15:59   Dg Hip Unilat W Or Wo Pelvis 2-3  Views Right  Result Date: 12/20/2018 CLINICAL DATA:  Low back pain, chronic worsened after 2 days. EXAM: DG HIP (WITH OR WITHOUT PELVIS) 2-3V RIGHT COMPARISON:  Lumbar spine of the same date. FINDINGS: AP view of the pelvis and two views of the right hip show no signs of fracture or dislocation. Soft tissues are normal. IMPRESSION: Negative. Electronically Signed   By: Zetta Bills M.D.   On: 12/20/2018 15:59    Procedures Procedures (including critical care time)  Medications Ordered in ED Medications  diazepam (VALIUM) tablet 5 mg (5 mg Oral Given 12/20/18 1528)  acetaminophen (TYLENOL) tablet 1,000 mg (1,000 mg Oral Given 12/20/18 1528)  ketorolac (TORADOL) 30 MG/ML injection 15 mg (15 mg Intramuscular Given 12/20/18 1528)     Initial Impression / Assessment and Plan / ED Course  I have reviewed the triage vital signs and the nursing notes.  Pertinent labs & imaging results that were available during my care of the patient were reviewed by me and considered in my medical decision making (see chart for details).         Patient with a past medical history of opioid dependence presents to the ED with complaint of right hip pain which began 2 days ago.  Patient reports she had a fall as she felt her left leg go from under her, she then reports a pressure along her buttocks radiating down her leg.  She has been taking her gabapentin without improvement in symptoms.  No other medical therapy has been tried.  Patient denies any  fevers, prior history of cancer, IV drug use.  Note her chart does have a prior visit for polysubstance abuse, opioid dependence.  We will provide her with pain medication along with x-ray to further evaluate her condition.  4:34 PM patient was reevaluated, reports mild improvement in symptoms.  Patient was not given any narcotic therapy while in the ED as she is currently on 60 mg of methadone, reports she did not go today due to her pain.  She is also taking steroids for her myasthenia gravis, recently had her therapy decrease to 10 mg daily.  We will not be prescribing steroids at this time as patient is currently on steroids for her myasthenia gravis.  Rice therapy is encouraged for patient.  She also be given Ortho referral to prevent any further exacerbations of this pain.   Portions of this note were generated with [employer]. Dictation errors may occur despite best attempts at proofreading.    Final Clinical Impressions(s) / ED Diagnoses   Final diagnoses:  Acute right-sided low back pain with sciatica, sciatica laterality unspecified    ED Discharge Orders         Ordered    cyclobenzaprine (FLEXERIL) 10 MG tablet  2 times daily     12/20/18 1632           Janeece Fitting, PA-C 12/20/18 1638    Noemi Chapel, MD 12/21/18 [id number]

## 2018-12-20 NOTE — ED Triage Notes (Signed)
Pain to RT lower back that radiates down leg since yesterday

## 2018-12-20 NOTE — Discharge Instructions (Addendum)
Your x-ray today was within normal limits.  I have prescribed a short course of muscle relaxers to help with your likely right side sciatica.  You may also apply heat or ice to your right hip in order to help with symptoms.

## 2018-12-20 NOTE — ED Notes (Signed)
See triage notes. Pt tearful and hollars out often

## 2018-12-20 NOTE — ED Notes (Signed)
Patient transported to X-ray 

## 2018-12-26 ENCOUNTER — Other Ambulatory Visit: Payer: Self-pay | Admitting: Neurology

## 2018-12-26 ENCOUNTER — Telehealth: Payer: Self-pay | Admitting: Orthopedic Surgery

## 2018-12-26 ENCOUNTER — Encounter (HOSPITAL_COMMUNITY): Payer: Self-pay | Admitting: Internal Medicine

## 2018-12-26 ENCOUNTER — Emergency Department (HOSPITAL_COMMUNITY): Payer: Medicaid Other

## 2018-12-26 ENCOUNTER — Other Ambulatory Visit (HOSPITAL_COMMUNITY): Payer: Self-pay | Admitting: Neurology

## 2018-12-26 ENCOUNTER — Other Ambulatory Visit: Payer: Self-pay

## 2018-12-26 ENCOUNTER — Inpatient Hospital Stay (HOSPITAL_COMMUNITY)
Admission: EM | Admit: 2018-12-26 | Discharge: 2018-12-28 | DRG: 481 | Disposition: A | Discharge status: Home / Self Care | Payer: Medicaid Other | Attending: Family Medicine | Admitting: Family Medicine

## 2018-12-26 ENCOUNTER — Inpatient Hospital Stay (HOSPITAL_COMMUNITY): Payer: Medicaid Other

## 2018-12-26 DIAGNOSIS — Z800 Family history of malignant neoplasm of digestive organs: Secondary | ICD-10-CM | POA: Diagnosis not present

## 2018-12-26 DIAGNOSIS — F329 Major depressive disorder, single episode, unspecified: Secondary | ICD-10-CM | POA: Diagnosis present

## 2018-12-26 DIAGNOSIS — Z79899 Other long term (current) drug therapy: Secondary | ICD-10-CM | POA: Diagnosis not present

## 2018-12-26 DIAGNOSIS — T148XXA Other injury of unspecified body region, initial encounter: Secondary | ICD-10-CM

## 2018-12-26 DIAGNOSIS — S72141A Displaced intertrochanteric fracture of right femur, initial encounter for closed fracture: Secondary | ICD-10-CM | POA: Diagnosis present

## 2018-12-26 DIAGNOSIS — M549 Dorsalgia, unspecified: Secondary | ICD-10-CM | POA: Diagnosis present

## 2018-12-26 DIAGNOSIS — S72044A Nondisplaced fracture of base of neck of right femur, initial encounter for closed fracture: Secondary | ICD-10-CM | POA: Diagnosis not present

## 2018-12-26 DIAGNOSIS — Z20828 Contact with and (suspected) exposure to other viral communicable diseases: Secondary | ICD-10-CM | POA: Diagnosis present

## 2018-12-26 DIAGNOSIS — G7000 Myasthenia gravis without (acute) exacerbation: Secondary | ICD-10-CM | POA: Diagnosis present

## 2018-12-26 DIAGNOSIS — Z01810 Encounter for preprocedural cardiovascular examination: Secondary | ICD-10-CM

## 2018-12-26 DIAGNOSIS — F17210 Nicotine dependence, cigarettes, uncomplicated: Secondary | ICD-10-CM | POA: Diagnosis present

## 2018-12-26 DIAGNOSIS — M5441 Lumbago with sciatica, right side: Secondary | ICD-10-CM | POA: Diagnosis present

## 2018-12-26 DIAGNOSIS — D62 Acute posthemorrhagic anemia: Secondary | ICD-10-CM | POA: Diagnosis not present

## 2018-12-26 DIAGNOSIS — G8929 Other chronic pain: Secondary | ICD-10-CM | POA: Diagnosis present

## 2018-12-26 DIAGNOSIS — G40909 Epilepsy, unspecified, not intractable, without status epilepticus: Secondary | ICD-10-CM | POA: Diagnosis present

## 2018-12-26 DIAGNOSIS — D72829 Elevated white blood cell count, unspecified: Secondary | ICD-10-CM | POA: Diagnosis present

## 2018-12-26 DIAGNOSIS — Z7952 Long term (current) use of systemic steroids: Secondary | ICD-10-CM

## 2018-12-26 DIAGNOSIS — S7291XA Unspecified fracture of right femur, initial encounter for closed fracture: Secondary | ICD-10-CM

## 2018-12-26 DIAGNOSIS — W19XXXA Unspecified fall, initial encounter: Secondary | ICD-10-CM | POA: Diagnosis present

## 2018-12-26 DIAGNOSIS — Z7982 Long term (current) use of aspirin: Secondary | ICD-10-CM

## 2018-12-26 DIAGNOSIS — S72001A Fracture of unspecified part of neck of right femur, initial encounter for closed fracture: Secondary | ICD-10-CM

## 2018-12-26 DIAGNOSIS — D4989 Neoplasm of unspecified behavior of other specified sites: Secondary | ICD-10-CM

## 2018-12-26 DIAGNOSIS — F1120 Opioid dependence, uncomplicated: Secondary | ICD-10-CM | POA: Diagnosis present

## 2018-12-26 DIAGNOSIS — S72009A Fracture of unspecified part of neck of unspecified femur, initial encounter for closed fracture: Secondary | ICD-10-CM

## 2018-12-26 LAB — CBC WITH DIFFERENTIAL/PLATELET
Abs Immature Granulocytes: 0.71 10*3/uL — ABNORMAL HIGH (ref 0.00–0.07)
Basophils Absolute: 0.1 10*3/uL (ref 0.0–0.1)
Basophils Relative: 0 %
Eosinophils Absolute: 0 10*3/uL (ref 0.0–0.5)
Eosinophils Relative: 0 %
HCT: 37.8 % (ref 36.0–46.0)
Hemoglobin: 11.9 g/dL — ABNORMAL LOW (ref 12.0–15.0)
Immature Granulocytes: 4 %
Lymphocytes Relative: 23 %
Lymphs Abs: 4.2 10*3/uL — ABNORMAL HIGH (ref 0.7–4.0)
MCH: 26.6 pg (ref 26.0–34.0)
MCHC: 31.5 g/dL (ref 30.0–36.0)
MCV: 84.6 fL (ref 80.0–100.0)
Monocytes Absolute: 1.1 10*3/uL — ABNORMAL HIGH (ref 0.1–1.0)
Monocytes Relative: 6 %
Neutro Abs: 12.1 10*3/uL — ABNORMAL HIGH (ref 1.7–7.7)
Neutrophils Relative %: 67 %
Platelets: 469 10*3/uL — ABNORMAL HIGH (ref 150–400)
RBC: 4.47 MIL/uL (ref 3.87–5.11)
RDW: 14.9 % (ref 11.5–15.5)
WBC: 18.1 10*3/uL — ABNORMAL HIGH (ref 4.0–10.5)
nRBC: 0 % (ref 0.0–0.2)

## 2018-12-26 LAB — BASIC METABOLIC PANEL
Anion gap: 8 (ref 5–15)
BUN: 16 mg/dL (ref 6–20)
CO2: 27 mmol/L (ref 22–32)
Calcium: 9 mg/dL (ref 8.9–10.3)
Chloride: 100 mmol/L (ref 98–111)
Creatinine, Ser: 0.51 mg/dL (ref 0.44–1.00)
GFR calc Af Amer: >60 mL/min (ref >60)
GFR calc non Af Amer: >60 mL/min (ref >60)
Glucose, Bld: 86 mg/dL (ref 70–99)
Potassium: 4.1 mmol/L (ref 3.5–5.1)
Sodium: 135 mmol/L (ref 135–145)

## 2018-12-26 LAB — POC URINE PREG, ED: Preg Test, Ur: NEGATIVE

## 2018-12-26 LAB — SARS CORONAVIRUS 2 BY RT PCR (HOSPITAL ORDER, PERFORMED IN ~~LOC~~ HOSPITAL LAB): SARS Coronavirus 2: NEGATIVE

## 2018-12-26 MED ORDER — ACETAMINOPHEN 325 MG PO TABS: 650.0000 mg | ORAL_TABLET | Freq: Four times a day (QID) | ORAL | Status: DC | PRN

## 2018-12-26 MED ORDER — SODIUM CHLORIDE 0.9 % IV SOLN
INTRAVENOUS | Status: DC
  Administered 2018-12-26 – 2018-12-27 (×3): via INTRAVENOUS

## 2018-12-26 MED ORDER — HYDROMORPHONE HCL 2 MG/ML IJ SOLN: 2.0000 mg | Freq: Once | INTRAMUSCULAR | Status: DC

## 2018-12-26 MED ORDER — METHADONE HCL 10 MG PO TABS
60.0000 mg | ORAL_TABLET | Freq: Every morning | ORAL | Status: DC
  Filled 2018-12-26: qty 6

## 2018-12-26 MED ORDER — PREDNISONE 20 MG PO TABS
20.0000 mg | ORAL_TABLET | Freq: Every day | ORAL | Status: DC
  Administered 2018-12-28: 09:00:00 20 mg via ORAL
  Filled 2018-12-26: qty 1

## 2018-12-26 MED ORDER — GABAPENTIN 600 MG PO TABS
600.0000 mg | ORAL_TABLET | Freq: Three times a day (TID) | ORAL | Status: DC
  Filled 2018-12-26 (×5): qty 1

## 2018-12-26 MED ORDER — ENOXAPARIN SODIUM 40 MG/0.4ML ~~LOC~~ SOLN
40.0000 mg | SUBCUTANEOUS | Status: DC
  Administered 2018-12-26: 40 mg via SUBCUTANEOUS
  Filled 2018-12-26: qty 0.4

## 2018-12-26 MED ORDER — HYDROMORPHONE HCL 1 MG/ML IJ SOLN
1.0000 mg | INTRAMUSCULAR | Status: DC | PRN
  Administered 2018-12-26 – 2018-12-27 (×2): 1 mg via INTRAVENOUS
  Filled 2018-12-26 (×2): qty 1

## 2018-12-26 MED ORDER — ONDANSETRON HCL 4 MG/2ML IJ SOLN: 4.0000 mg | Freq: Four times a day (QID) | INTRAMUSCULAR | Status: DC | PRN

## 2018-12-26 MED ORDER — GABAPENTIN 300 MG PO CAPS
ORAL_CAPSULE | ORAL | Status: AC
  Filled 2018-12-26: qty 2

## 2018-12-26 MED ORDER — HYDROMORPHONE HCL 1 MG/ML IJ SOLN
1.0000 mg | Freq: Once | INTRAMUSCULAR | Status: AC
  Administered 2018-12-26: 1 mg via INTRAVENOUS
  Filled 2018-12-26: qty 1

## 2018-12-26 MED ORDER — PYRIDOSTIGMINE BROMIDE 60 MG PO TABS
30.0000 mg | ORAL_TABLET | Freq: Three times a day (TID) | ORAL | Status: DC
  Administered 2018-12-27 – 2018-12-28 (×3): 30 mg via ORAL

## 2018-12-26 MED ORDER — ACETAMINOPHEN 650 MG RE SUPP: 650.0000 mg | Freq: Four times a day (QID) | RECTAL | Status: DC | PRN

## 2018-12-26 NOTE — ED Triage Notes (Addendum)
Pt here on the 24th for same. Was discharged with Flexeril. Pain in the right hip that radiates down leg. Pt states she may have Myasthenia Gravis. Given 100 mcg of Fentanyl

## 2018-12-26 NOTE — Telephone Encounter (Signed)
Called patient, 1:55pm today, following receipt of voice message, which patient had left just prior to close of morning office schedule, in which she relayed that she had worsening right leg pain; said she was also being referred by Dr Merlene Laughter. Patient states she is already on her way to [facility], due to the pain being bad enough that she had trouble trying to walk. Michela Pitcher will call back after visit there.

## 2018-12-26 NOTE — H&P (Signed)
TRH H&P    Patient Demographics:    Mallory Mitchell, is a 33 y.o. female  MRN: [id number]  DOB - [birth year]  Admit Date - 12/26/2018  Referring MD/NP/PA:  Blinda Leatherwood  Outpatient Primary MD for the patient is Patient, No Pcp Per Donalda Ewings - neurology  Patient coming from: home  Chief complaint- right hip pain   HPI:    Mallory Mitchell  is a 33 y.o. female, w myasthenia gravis (new diagnosis), seizure do , depression, on chronic prednisone for the past few months c/o fall , mechanical somewhere around 12/16/18 or 12/18/18 apparently presented to ER due to pain on 12/20/2018 w negative xray and then represented today due to continued pain after seeing Kofi [place] yesterday.    In ED,  T 99, P 85  R 16, Bp 126/82  Pox 99% on RA Wt 81.6kg   Wbc 18.1, Hgb 11.9, Plt 469 Na 135, K 4.1,  Bun 16, Creatinine 0.51  CT pelvis IMPRESSION: Undisplaced fracture at the base of the right femoral neck without significant extension into the intratrochanteric region.  No other focal abnormality is noted  Ed spoke with Dr. Aline Brochure  Pt will be admitted for right femoral neck fracture.     Review of systems:    In addition to the HPI above,  No Fever-chills, No Headache, No changes with Vision or hearing, No problems swallowing food or Liquids, No Chest pain, Cough or Shortness of Breath, No Abdominal pain, No Nausea or Vomiting, bowel movements are regular, No Blood in stool or Urine, No dysuria, No new skin rashes or bruises,  No new weakness, tingling, numbness in any extremity, No recent weight gain or loss, No polyuria, polydypsia or polyphagia, No significant Mental Stressors.  All other systems reviewed and are negative.    Past History of the following :    Past Medical History:  Diagnosis Date  . Depression   . Myasthenia gravis ([place])   . Seizures ([place])       Past Surgical  History:  Procedure Laterality Date  . MOUTH SURGERY        Social History:      Social History   Tobacco Use  . Smoking status: Current Some Day Smoker    Packs/day: 0.50    Types: Cigarettes  . Smokeless tobacco: Never Used  Substance Use Topics  . Alcohol use: Yes    Comment: occ       Family History :     Family History  Problem Relation Age of Onset  . Liver cancer Mother        Home Medications:   Prior to Admission medications   Medication Sig Start Date End Date Taking? Authorizing Provider  acetaminophen (TYLENOL) 500 MG tablet Take 500 mg by mouth every 6 (six) hours as needed for mild pain.   Yes [provider]  Aspirin-Salicylamide-Caffeine (BC FAST PAIN RELIEF) 650-195-33.3 MG PACK Take 1 Package by mouth daily as needed (for pain).    Yes [provider]  gabapentin (NEURONTIN)  600 MG tablet Take 600 mg by mouth 3 (three) times daily.   Yes [provider]  methadone (DOLOPHINE) 10 MG/ML solution Take 60 mg by mouth every morning.   Yes [provider]  predniSONE (DELTASONE) 10 MG tablet Take 20 mg by mouth daily with breakfast.   Yes [provider]  pyridostigmine (MESTINON) 60 MG tablet Take 30 mg by mouth 3 (three) times daily.   Yes [provider]  cyclobenzaprine (FLEXERIL) 10 MG tablet Take 1 tablet (10 mg total) by mouth 2 (two) times daily for 7 days. Patient not taking: Reported on 12/26/2018 12/20/18 12/27/18  Janeece Fitting, PA-C     Allergies:    No Known Allergies   Physical Exam:   Vitals  Blood pressure 126/82, pulse 80, temperature 99 F (37.2 C), resp. rate 16, weight 81.6 kg, SpO2 99 %.  1.  General: axoxo3  2. Psychiatric: euthymic  3. Neurologic: nonfocal  4. HEENMT:  Anicteric, pupils 1.63mm symmetric, direct, consensual, near intact Neck: no jvd  5. Respiratory : CTAB  6. Cardiovascular : rrr s1, s2, no m/g/r  7. Gastrointestinal:  ABd: soft, nt, nd,  +bs  8. Skin:  EXt: no c/c/e, cushingoid face  9.Musculoskeletal:  Good ROM except R hip    Data Review:    CBC Recent Labs  Lab 12/26/18 1952  WBC 18.1*  HGB 11.9*  HCT 37.8  PLT 469*  MCV 84.6  MCH 26.6  MCHC 31.5  RDW 14.9  LYMPHSABS 4.2*  MONOABS 1.1*  EOSABS 0.0  BASOSABS 0.1   ------------------------------------------------------------------------------------------------------------------  Results for orders placed or performed during the hospital encounter of 12/26/18 (from the past 48 hour(s))  POC Urine Pregnancy, ED (not at [facility])     Status: None   Collection Time: 12/26/18  5:54 PM  Result Value Ref Range   Preg Test, Ur NEGATIVE NEGATIVE    Comment:        THE SENSITIVITY OF THIS METHODOLOGY IS >24 mIU/mL   CBC with Differential     Status: Abnormal   Collection Time: 12/26/18  7:52 PM  Result Value Ref Range   WBC 18.1 (H) 4.0 - 10.5 K/uL   RBC 4.47 3.87 - 5.11 MIL/uL   Hemoglobin 11.9 (L) 12.0 - 15.0 g/dL   HCT 37.8 36.0 - 46.0 %   MCV 84.6 80.0 - 100.0 fL   MCH 26.6 26.0 - 34.0 pg   MCHC 31.5 30.0 - 36.0 g/dL   RDW 14.9 11.5 - 15.5 %   Platelets 469 (H) 150 - 400 K/uL   nRBC 0.0 0.0 - 0.2 %   Neutrophils Relative % 67 %   Neutro Abs 12.1 (H) 1.7 - 7.7 K/uL   Lymphocytes Relative 23 %   Lymphs Abs 4.2 (H) 0.7 - 4.0 K/uL   Monocytes Relative 6 %   Monocytes Absolute 1.1 (H) 0.1 - 1.0 K/uL   Eosinophils Relative 0 %   Eosinophils Absolute 0.0 0.0 - 0.5 K/uL   Basophils Relative 0 %   Basophils Absolute 0.1 0.0 - 0.1 K/uL   Immature Granulocytes 4 %   Abs Immature Granulocytes 0.71 (H) 0.00 - 0.07 K/uL    Comment: Performed at [facility], [address], [place], [place] [address]    Chemistries  No results for input(s): NA, K, CL, CO2, GLUCOSE, BUN, CREATININE, CALCIUM, MG, AST, ALT, ALKPHOS, BILITOT in the last 168 hours.  Invalid input(s):  GFRCGP ------------------------------------------------------------------------------------------------------------------  ------------------------------------------------------------------------------------------------------------------ GFR: CrCl cannot be  calculated (Patient's most recent lab result is older than the maximum 21 days allowed.). Liver Function Tests: No results for input(s): AST, ALT, ALKPHOS, BILITOT, PROT, ALBUMIN in the last 168 hours. No results for input(s): LIPASE, AMYLASE in the last 168 hours. No results for input(s): AMMONIA in the last 168 hours. Coagulation Profile: No results for input(s): INR, PROTIME in the last 168 hours. Cardiac Enzymes: No results for input(s): CKTOTAL, CKMB, CKMBINDEX, TROPONINI in the last 168 hours. BNP (last 3 results) No results for input(s): PROBNP in the last 8760 hours. HbA1C: No results for input(s): HGBA1C in the last 72 hours. CBG: No results for input(s): GLUCAP in the last 168 hours. Lipid Profile: No results for input(s): CHOL, HDL, LDLCALC, TRIG, CHOLHDL, LDLDIRECT in the last 72 hours. Thyroid Function Tests: No results for input(s): TSH, T4TOTAL, FREET4, T3FREE, THYROIDAB in the last 72 hours. Anemia Panel: No results for input(s): VITAMINB12, FOLATE, FERRITIN, TIBC, IRON, RETICCTPCT in the last 72 hours.  --------------------------------------------------------------------------------------------------------------- Urine analysis:    Component Value Date/Time   COLORURINE YELLOW 11/28/2012 1720   APPEARANCEUR CLEAR 11/28/2012 1720   LABSPEC >1.030 (H) 11/28/2012 1720   PHURINE 5.5 11/28/2012 1720   GLUCOSEU NEGATIVE 11/28/2012 1720   HGBUR LARGE (A) 11/28/2012 1720   BILIRUBINUR NEGATIVE 11/28/2012 1720   KETONESUR NEGATIVE 11/28/2012 1720   PROTEINUR NEGATIVE 11/28/2012 1720   UROBILINOGEN 0.2 11/28/2012 1720   NITRITE NEGATIVE 11/28/2012 1720   LEUKOCYTESUR NEGATIVE 11/28/2012 1720      Imaging  Results:    Ct Lumbar Spine Wo Contrast  Result Date: 12/26/2018 CLINICAL DATA:  33 year old female with low back pain radiating down right hip. EXAM: CT LUMBAR SPINE WITHOUT CONTRAST TECHNIQUE: Multidetector CT imaging of the lumbar spine was performed without intravenous contrast administration. Multiplanar CT image reconstructions were also generated. COMPARISON:  Lumbar spine radiograph dated 12/20/2018 FINDINGS: Segmentation: 5 lumbar type vertebrae. Alignment: Normal. Vertebrae: No acute fracture or focal pathologic process. Paraspinal and other soft tissues: Negative. Disc levels: Unremarkable. No degenerative changes. The neural foramina are patent. Probable old healed left posterior twelfth rib fracture. IMPRESSION: No acute/traumatic lumbar spine pathology. No degenerative changes or neural foramina stenosis. Electronically Signed   By: Anner Crete M.D.   On: 12/26/2018 19:00   Ct Pelvis Wo Contrast  Result Date: 12/26/2018 CLINICAL DATA:  Right-sided hip pain with negative films, subsequent encounter EXAM: CT PELVIS WITHOUT CONTRAST TECHNIQUE: Multidetector CT imaging of the pelvis was performed following the standard protocol without intravenous contrast. COMPARISON:  12/20/2018 FINDINGS: Urinary Tract: Bladder is partially distended. Visualized kidneys appear within normal limits. Bowel: Appendix is unremarkable. No specific bowel abnormality is noted. Vascular/Lymphatic: No pathologically enlarged lymph nodes. No significant vascular abnormality seen. Reproductive: Uterus appears within normal limits. No ovarian lesions are seen. Other:  No free fluid is noted. Musculoskeletal: There is an undisplaced fracture at the base of the right femoral neck without significant extension in the intratrochanteric region. Acetabula are within normal limits. No other fracture is seen. IMPRESSION: Undisplaced fracture at the base of the right femoral neck without significant extension into the  intratrochanteric region. No other focal abnormality is noted. Electronically Signed   By: Inez Catalina M.D.   On: 12/26/2018 18:55     Assessment & Plan:    Principal Problem:   Femoral neck fracture (HCC)  R nondisplaced femoral neck fracture NPO 12 lead ekg CXR Dilaudid 1mg  iv q4h prn Zofran 4mg  iv q6h prn Orthopedics consulted by ED, appreciate input  Hx of opioid dep Cont Methadone 60mg  po qday  Myathenia gravis Cont Prednisone 20mg  po qday Cont Pyridostigmine   Chronic pain Cont Gabapentin  DVT Prophylaxis-   Lovenox - SCDs  AM Labs Ordered, also please review Full Orders  Family Communication: Admission, patients condition and plan of care including tests being ordered have been discussed with the patient  who indicate understanding and agree with the plan and Code Status.  Code Status:  FULL CODE per patient,   Admission status: Inpatient: Based on patients clinical presentation and evaluation of above clinical data, I have made determination that patient meets Inpatient criteria at this time.pt has right femoral neck fracture, pt has high risk of clinical deterioration.  Pt will require > 2 nites stay and inpatient status  Time spent in minutes : 55 minutes   Jani Gravel M.D on 12/26/2018 at 8:33 PM

## 2018-12-26 NOTE — ED Provider Notes (Signed)
[facility] EMERGENCY DEPARTMENT Provider Note   CSN: [id number] Arrival date & time: 12/26/18  1420     History   Chief Complaint Chief Complaint  Patient presents with  . Leg Pain    HPI Mallory Mitchell is a 33 y.o. female.     HPI   Patient is a 33 year old female with a history of depression, myasthenia gravis, seizures, who presents to the emergency department today for evaluation of right lower back pain/buttock pain.  Pain has been present since she fell about a week ago.  It radiates down her right lower extremity.  Pain is constant and severe in nature.  States she has been having trouble walking around her home due to the pain.  She denies any numbness/weakness, urinary or bowel incontinence or urinary retention.  She has had no fevers.  She states all she has been taking at home is gabapentin.  I did review her prior records from when she was seen last week, she had negative imaging of her lumbar spine and pelvis/hips.  She was discharged with Flexeril.  She talked with her orthopedic doctor who felt that she may need further imaging.  She denies any history of IV drug use however she is currently on methadone.  She states she has not been able to go to the methadone clinic for the last 2 days because of her pain.  Past Medical History:  Diagnosis Date  . Depression   . Myasthenia gravis ([place])   . Seizures [facility])     Patient Active Problem List   Diagnosis Date Noted  . Femoral neck fracture ([place]) 12/26/2018    Past Surgical History:  Procedure Laterality Date  . MOUTH SURGERY       OB History   No obstetric history on file.      Home Medications    Prior to Admission medications   Medication Sig Start Date End Date Taking? Authorizing Provider  acetaminophen (TYLENOL) 500 MG tablet Take 500 mg by mouth every 6 (six) hours as needed for mild pain.   Yes [provider]  Aspirin-Salicylamide-Caffeine (BC FAST PAIN RELIEF) 650-195-33.3 MG PACK  Take 1 Package by mouth daily as needed (for pain).    Yes [provider]  gabapentin (NEURONTIN) 600 MG tablet Take 600 mg by mouth 3 (three) times daily.   Yes [provider]  methadone (DOLOPHINE) 10 MG/ML solution Take 60 mg by mouth every morning.   Yes [provider]  predniSONE (DELTASONE) 10 MG tablet Take 20 mg by mouth daily with breakfast.   Yes [provider]  pyridostigmine (MESTINON) 60 MG tablet Take 30 mg by mouth 3 (three) times daily.   Yes [provider]  cyclobenzaprine (FLEXERIL) 10 MG tablet Take 1 tablet (10 mg total) by mouth 2 (two) times daily for 7 days. Patient not taking: Reported on 12/26/2018 12/20/18 12/27/18  Janeece Fitting, PA-C    Family History Family History  Problem Relation Age of Onset  . Liver cancer Mother     Social History Social History   Tobacco Use  . Smoking status: Current Some Day Smoker    Packs/day: 0.50    Types: Cigarettes  . Smokeless tobacco: Never Used  Substance Use Topics  . Alcohol use: Yes    Comment: occ  . Drug use: Yes    Types: Cocaine, Marijuana    Comment: Denies     Allergies   Patient has no known allergies.   Review  of Systems Review of Systems  Constitutional: Negative for fever.  HENT: Negative for ear pain and sore throat.   Eyes: Negative for visual disturbance.  Respiratory: Negative for cough and shortness of breath.   Cardiovascular: Negative for chest pain.  Gastrointestinal: Negative for abdominal pain, constipation, diarrhea, nausea and vomiting.  Genitourinary: Negative for dysuria and hematuria.       No loss of control of bowel or bladder function  Musculoskeletal: Positive for back pain.  Skin: Negative for rash.  Neurological: Negative for weakness and numbness.  All other systems reviewed and are negative.    Physical Exam Updated Vital Signs BP 126/82   Pulse 80   Temp 99 F (37.2 C)   Resp 16   Wt 81.6 kg   SpO2 99%   BMI  30.90 kg/m   Physical Exam Vitals signs and nursing note reviewed.  Constitutional:      General: She is not in acute distress.    Appearance: She is well-developed.  HENT:     Head: Normocephalic and atraumatic.  Eyes:     Conjunctiva/sclera: Conjunctivae normal.  Neck:     Musculoskeletal: Neck supple.  Cardiovascular:     Rate and Rhythm: Normal rate.     Pulses: Normal pulses.  Pulmonary:     Effort: Pulmonary effort is normal. No respiratory distress.  Abdominal:     General: Bowel sounds are normal.     Palpations: Abdomen is soft.     Tenderness: There is no abdominal tenderness.  Musculoskeletal:     Comments: TTP to the lumbar spine and to the right buttock and right hip  Skin:    General: Skin is warm and dry.  Neurological:     Mental Status: She is alert.     Comments: 5/5 strength to the LLE. 4/5 strength to the RLE 2/2 (suspect secondary to pain/effort). Normal sensation throughout.     ED Treatments / Results  Labs (all labs ordered are listed, but only abnormal results are displayed) Labs Reviewed  CBC WITH DIFFERENTIAL/PLATELET - Abnormal; Notable for the following components:      Result Value   WBC 18.1 (*)    Hemoglobin 11.9 (*)    Platelets 469 (*)    Neutro Abs 12.1 (*)    Lymphs Abs 4.2 (*)    Monocytes Absolute 1.1 (*)    Abs Immature Granulocytes 0.71 (*)    All other components within normal limits  SARS CORONAVIRUS 2 (HOSPITAL ORDER, [place] [facility])  BASIC METABOLIC PANEL  RAPID URINE DRUG SCREEN, HOSP PERFORMED  VITAMIN D 25 HYDROXY (VIT D DEFICIENCY, FRACTURES)  HIV ANTIBODY (ROUTINE TESTING W REFLEX)  HIV4GL SAVE TUBE  COMPREHENSIVE METABOLIC PANEL  CBC  POC URINE PREG, ED    EKG None  Radiology Ct Lumbar Spine Wo Contrast  Result Date: 12/26/2018 CLINICAL DATA:  33 year old female with low back pain radiating down right hip. EXAM: CT LUMBAR SPINE WITHOUT CONTRAST TECHNIQUE: Multidetector CT imaging  of the lumbar spine was performed without intravenous contrast administration. Multiplanar CT image reconstructions were also generated. COMPARISON:  Lumbar spine radiograph dated 12/20/2018 FINDINGS: Segmentation: 5 lumbar type vertebrae. Alignment: Normal. Vertebrae: No acute fracture or focal pathologic process. Paraspinal and other soft tissues: Negative. Disc levels: Unremarkable. No degenerative changes. The neural foramina are patent. Probable old healed left posterior twelfth rib fracture. IMPRESSION: No acute/traumatic lumbar spine pathology. No degenerative changes or neural foramina stenosis. Electronically Signed   By: Milas Hock  Radparvar M.D.   On: 12/26/2018 19:00   Ct Pelvis Wo Contrast  Result Date: 12/26/2018 CLINICAL DATA:  Right-sided hip pain with negative films, subsequent encounter EXAM: CT PELVIS WITHOUT CONTRAST TECHNIQUE: Multidetector CT imaging of the pelvis was performed following the standard protocol without intravenous contrast. COMPARISON:  12/20/2018 FINDINGS: Urinary Tract: Bladder is partially distended. Visualized kidneys appear within normal limits. Bowel: Appendix is unremarkable. No specific bowel abnormality is noted. Vascular/Lymphatic: No pathologically enlarged lymph nodes. No significant vascular abnormality seen. Reproductive: Uterus appears within normal limits. No ovarian lesions are seen. Other:  No free fluid is noted. Musculoskeletal: There is an undisplaced fracture at the base of the right femoral neck without significant extension in the intratrochanteric region. Acetabula are within normal limits. No other fracture is seen. IMPRESSION: Undisplaced fracture at the base of the right femoral neck without significant extension into the intratrochanteric region. No other focal abnormality is noted. Electronically Signed   By: Inez Catalina M.D.   On: 12/26/2018 18:55    Procedures Procedures (including critical care time)  Medications Ordered in ED Medications   enoxaparin (LOVENOX) injection 40 mg (has no administration in time range)  0.9 %  sodium chloride infusion (has no administration in time range)  acetaminophen (TYLENOL) tablet 650 mg (has no administration in time range)    Or  acetaminophen (TYLENOL) suppository 650 mg (has no administration in time range)  HYDROmorphone (DILAUDID) injection 1 mg (has no administration in time range)  ondansetron (ZOFRAN) injection 4 mg (has no administration in time range)  HYDROmorphone (DILAUDID) injection 1 mg (1 mg Intravenous Given 12/26/18 1548)  HYDROmorphone (DILAUDID) injection 1 mg (1 mg Intravenous Given 12/26/18 2014)     Initial Impression / Assessment and Plan / ED Course  I have reviewed the triage vital signs and the nursing notes.  Pertinent labs & imaging results that were available during my care of the patient were reviewed by me and considered in my medical decision making (see chart for details).   Final Clinical Impressions(s) / ED Diagnoses   Final diagnoses:  Back pain  Closed fracture of right femur, unspecified fracture morphology, unspecified portion of femur, initial encounter ([place])   33 year old female presenting with right lower back/buttock pain radiating down the right lower extremity which started after a fall last week.    She has no red flag signs/symptoms to suggest cauda equina, transverse myelitis, discitis.  Xrays completed of lumbar spine and pelvis were negative. She is having persistent pain.   Will give pain meds and order further imaging.    7:29 PM Discussed case with Dr. Aline Brochure who recommends admission to hospitalist service and he will operate tomorrow morning. He is recommending completion of repeat xray of the pelvis/hip.   7:55 PM CONSULT with Dr. Maudie Mercury who accepts patient for admission   Labs and COVID ordered.    ED Discharge Orders    None       Bishop Dublin 12/26/18 2056    Margette Fast, MD 12/27/18 2028

## 2018-12-26 NOTE — Progress Notes (Signed)
Patient ID: Mallory Mitchell, female   DOB: 07-Oct-1985, 33 y.o.   MRN: [id number] BP 126/82   Pulse 80   Temp 99 F (37.2 C)   Resp 16   Wt 81.6 kg   SpO2 99%   BMI 30.90 kg/m   Right hip fracture   Needs orif with [facility] right hip

## 2018-12-27 ENCOUNTER — Encounter (HOSPITAL_COMMUNITY): Payer: Self-pay | Admitting: *Deleted

## 2018-12-27 ENCOUNTER — Inpatient Hospital Stay (HOSPITAL_COMMUNITY): Payer: Medicaid Other

## 2018-12-27 ENCOUNTER — Encounter (HOSPITAL_COMMUNITY)
Admission: EM | Disposition: A | Discharge status: Home / Self Care | Payer: Self-pay | Source: Home / Self Care | Attending: Family Medicine

## 2018-12-27 ENCOUNTER — Inpatient Hospital Stay (HOSPITAL_COMMUNITY): Payer: Medicaid Other | Admitting: Anesthesiology

## 2018-12-27 DIAGNOSIS — S72044A Nondisplaced fracture of base of neck of right femur, initial encounter for closed fracture: Secondary | ICD-10-CM

## 2018-12-27 HISTORY — PX: COMPRESSION HIP SCREW: SHX1386

## 2018-12-27 LAB — TYPE AND SCREEN
ABO/RH(D): O NEG
Antibody Screen: NEGATIVE

## 2018-12-27 LAB — COMPREHENSIVE METABOLIC PANEL
ALT: 16 U/L (ref 0–44)
AST: 12 U/L — ABNORMAL LOW (ref 15–41)
Albumin: 3.3 g/dL — ABNORMAL LOW (ref 3.5–5.0)
Alkaline Phosphatase: 55 U/L (ref 38–126)
Anion gap: 10 (ref 5–15)
BUN: 19 mg/dL (ref 6–20)
CO2: 27 mmol/L (ref 22–32)
Calcium: 9 mg/dL (ref 8.9–10.3)
Chloride: 100 mmol/L (ref 98–111)
Creatinine, Ser: 0.55 mg/dL (ref 0.44–1.00)
GFR calc Af Amer: >60 mL/min (ref >60)
GFR calc non Af Amer: >60 mL/min (ref >60)
Glucose, Bld: 98 mg/dL (ref 70–99)
Potassium: 4.2 mmol/L (ref 3.5–5.1)
Sodium: 137 mmol/L (ref 135–145)
Total Bilirubin: 0.5 mg/dL (ref 0.3–1.2)
Total Protein: 6.3 g/dL — ABNORMAL LOW (ref 6.5–8.1)

## 2018-12-27 LAB — HIV ANTIBODY (ROUTINE TESTING W REFLEX): HIV Screen 4th Generation wRfx: NONREACTIVE

## 2018-12-27 LAB — CBC
HCT: 38.7 % (ref 36.0–46.0)
Hemoglobin: 12 g/dL (ref 12.0–15.0)
MCH: 26.8 pg (ref 26.0–34.0)
MCHC: 31 g/dL (ref 30.0–36.0)
MCV: 86.4 fL (ref 80.0–100.0)
Platelets: 489 10*3/uL — ABNORMAL HIGH (ref 150–400)
RBC: 4.48 MIL/uL (ref 3.87–5.11)
RDW: 15 % (ref 11.5–15.5)
WBC: 19.8 10*3/uL — ABNORMAL HIGH (ref 4.0–10.5)
nRBC: 0 % (ref 0.0–0.2)

## 2018-12-27 LAB — SURGICAL PCR SCREEN
MRSA, PCR: POSITIVE — AB
Staphylococcus aureus: POSITIVE — AB

## 2018-12-27 LAB — HCG, QUANTITATIVE, PREGNANCY: hCG, Beta Chain, Quant, S: <1 m[IU]/mL (ref <5)

## 2018-12-27 LAB — VITAMIN D 25 HYDROXY (VIT D DEFICIENCY, FRACTURES): Vit D, 25-Hydroxy: 23.79 ng/mL — ABNORMAL LOW (ref 30–100)

## 2018-12-27 SURGERY — COMPRESSION HIP
Anesthesia: General | Site: Hip | Laterality: Right

## 2018-12-27 MED ORDER — FENTANYL CITRATE (PF) 100 MCG/2ML IJ SOLN
INTRAMUSCULAR | Status: DC | PRN
  Administered 2018-12-27 (×2): 50 ug via INTRAVENOUS
  Administered 2018-12-27 (×2): 100 ug via INTRAVENOUS
  Administered 2018-12-27 (×2): 50 ug via INTRAVENOUS
  Administered 2018-12-27: 100 ug via INTRAVENOUS
  Administered 2018-12-27: 50 ug via INTRAVENOUS
  Administered 2018-12-27 (×2): 100 ug via INTRAVENOUS

## 2018-12-27 MED ORDER — METHADONE HCL 10 MG PO TABS
30.0000 mg | ORAL_TABLET | Freq: Once | ORAL | Status: AC
  Administered 2018-12-27: 12:00:00 30 mg via ORAL
  Filled 2018-12-27: qty 3

## 2018-12-27 MED ORDER — SENNOSIDES-DOCUSATE SODIUM 8.6-50 MG PO TABS
2.0000 | ORAL_TABLET | Freq: Two times a day (BID) | ORAL | Status: DC
  Administered 2018-12-28: 09:00:00 2 via ORAL
  Filled 2018-12-27 (×2): qty 2

## 2018-12-27 MED ORDER — CEFAZOLIN SODIUM-DEXTROSE 2-4 GM/100ML-% IV SOLN
2.0000 g | INTRAVENOUS | Status: DC
  Filled 2018-12-27: qty 100

## 2018-12-27 MED ORDER — GLYCOPYRROLATE PF 0.2 MG/ML IJ SOSY
PREFILLED_SYRINGE | INTRAMUSCULAR | Status: AC
  Filled 2018-12-27: qty 1

## 2018-12-27 MED ORDER — HYDROMORPHONE HCL 1 MG/ML IJ SOLN
0.5000 mg | INTRAMUSCULAR | Status: DC | PRN
  Filled 2018-12-27: qty 1

## 2018-12-27 MED ORDER — PROPOFOL 10 MG/ML IV BOLUS
INTRAVENOUS | Status: DC | PRN
  Administered 2018-12-27: 230 mg via INTRAVENOUS

## 2018-12-27 MED ORDER — DEXAMETHASONE SODIUM PHOSPHATE 4 MG/ML IJ SOLN
INTRAMUSCULAR | Status: DC | PRN
  Administered 2018-12-27: 10 mg via INTRAVENOUS

## 2018-12-27 MED ORDER — CELECOXIB 100 MG PO CAPS
200.0000 mg | ORAL_CAPSULE | Freq: Two times a day (BID) | ORAL | Status: DC
  Administered 2018-12-27 – 2018-12-28 (×3): 200 mg via ORAL
  Filled 2018-12-27 (×3): qty 2

## 2018-12-27 MED ORDER — HYDROCODONE-ACETAMINOPHEN 7.5-325 MG PO TABS: 1.0000 | ORAL_TABLET | Freq: Once | ORAL | Status: DC | PRN

## 2018-12-27 MED ORDER — ASPIRIN EC 325 MG PO TBEC
325.0000 mg | DELAYED_RELEASE_TABLET | Freq: Every day | ORAL | Status: DC
  Administered 2018-12-28: 09:00:00 325 mg via ORAL
  Filled 2018-12-27: qty 1

## 2018-12-27 MED ORDER — HYDROMORPHONE HCL 1 MG/ML IJ SOLN
1.0000 mg | Freq: Four times a day (QID) | INTRAMUSCULAR | Status: DC | PRN
  Administered 2018-12-27 – 2018-12-28 (×3): 1 mg via INTRAVENOUS
  Filled 2018-12-27 (×2): qty 1

## 2018-12-27 MED ORDER — ONDANSETRON HCL 4 MG/2ML IJ SOLN
INTRAMUSCULAR | Status: DC | PRN
  Administered 2018-12-27: 4 mg via INTRAVENOUS

## 2018-12-27 MED ORDER — MIDAZOLAM HCL 2 MG/2ML IJ SOLN
INTRAMUSCULAR | Status: AC
  Filled 2018-12-27: qty 2

## 2018-12-27 MED ORDER — LIDOCAINE 2% (20 MG/ML) 5 ML SYRINGE
INTRAMUSCULAR | Status: AC
  Filled 2018-12-27: qty 5

## 2018-12-27 MED ORDER — LACTATED RINGERS IV SOLN
INTRAVENOUS | Status: DC
  Administered 2018-12-27: 08:00:00 1000 mL via INTRAVENOUS

## 2018-12-27 MED ORDER — KETOROLAC TROMETHAMINE 15 MG/ML IJ SOLN
15.0000 mg | Freq: Once | INTRAMUSCULAR | Status: AC
  Administered 2018-12-27: 15 mg via INTRAVENOUS
  Filled 2018-12-27: qty 1

## 2018-12-27 MED ORDER — ONDANSETRON HCL 4 MG PO TABS: 4.0000 mg | ORAL_TABLET | Freq: Four times a day (QID) | ORAL | Status: DC | PRN

## 2018-12-27 MED ORDER — MIDAZOLAM HCL 5 MG/5ML IJ SOLN
INTRAMUSCULAR | Status: DC | PRN
  Administered 2018-12-27: 2 mg via INTRAVENOUS

## 2018-12-27 MED ORDER — VANCOMYCIN HCL 10 G IV SOLR
1500.0000 mg | Freq: Once | INTRAVENOUS | Status: AC
  Administered 2018-12-27: 1500 mg via INTRAVENOUS
  Filled 2018-12-27: qty 1500

## 2018-12-27 MED ORDER — ARTIFICIAL TEARS OPHTHALMIC OINT
TOPICAL_OINTMENT | OPHTHALMIC | Status: AC
  Filled 2018-12-27: qty 3.5

## 2018-12-27 MED ORDER — HYDROMORPHONE HCL 1 MG/ML IJ SOLN
0.2500 mg | INTRAMUSCULAR | Status: DC | PRN
  Administered 2018-12-27 (×4): 0.5 mg via INTRAVENOUS
  Filled 2018-12-27 (×4): qty 0.5

## 2018-12-27 MED ORDER — DEXAMETHASONE SODIUM PHOSPHATE 10 MG/ML IJ SOLN
INTRAMUSCULAR | Status: AC
  Filled 2018-12-27: qty 1

## 2018-12-27 MED ORDER — OXYCODONE HCL 5 MG PO TABS
10.0000 mg | ORAL_TABLET | ORAL | Status: DC | PRN
  Administered 2018-12-27 – 2018-12-28 (×5): 15 mg via ORAL
  Filled 2018-12-27 (×5): qty 3

## 2018-12-27 MED ORDER — PHENOL 1.4 % MT LIQD: 1.0000 | OROMUCOSAL | Status: DC | PRN

## 2018-12-27 MED ORDER — FENTANYL CITRATE (PF) 250 MCG/5ML IJ SOLN
INTRAMUSCULAR | Status: AC
  Filled 2018-12-27: qty 5

## 2018-12-27 MED ORDER — CHLORHEXIDINE GLUCONATE CLOTH 2 % EX PADS: 6.0000 | MEDICATED_PAD | Freq: Every day | CUTANEOUS | Status: DC

## 2018-12-27 MED ORDER — MUPIROCIN 2 % EX OINT
1.0000 "application " | TOPICAL_OINTMENT | Freq: Two times a day (BID) | CUTANEOUS | Status: DC
  Administered 2018-12-28: 1 via NASAL

## 2018-12-27 MED ORDER — SENNOSIDES-DOCUSATE SODIUM 8.6-50 MG PO TABS: 1.0000 | ORAL_TABLET | Freq: Every evening | ORAL | Status: DC | PRN

## 2018-12-27 MED ORDER — METOCLOPRAMIDE HCL 5 MG/ML IJ SOLN: 5.0000 mg | Freq: Three times a day (TID) | INTRAMUSCULAR | Status: DC | PRN

## 2018-12-27 MED ORDER — BUPIVACAINE LIPOSOME 1.3 % IJ SUSP
INTRAMUSCULAR | Status: DC | PRN
  Administered 2018-12-27: 20 mL

## 2018-12-27 MED ORDER — GABAPENTIN 300 MG PO CAPS
600.0000 mg | ORAL_CAPSULE | Freq: Three times a day (TID) | ORAL | Status: DC
  Administered 2018-12-27 – 2018-12-28 (×4): 600 mg via ORAL
  Filled 2018-12-27 (×4): qty 2

## 2018-12-27 MED ORDER — METHADONE HCL 10 MG PO TABS
30.0000 mg | ORAL_TABLET | Freq: Once | ORAL | Status: AC
  Administered 2018-12-27: 15:00:00 30 mg via ORAL

## 2018-12-27 MED ORDER — BUPIVACAINE-EPINEPHRINE (PF) 0.5% -1:200000 IJ SOLN
INTRAMUSCULAR | Status: DC | PRN
  Administered 2018-12-27: 20 mL

## 2018-12-27 MED ORDER — GLYCOPYRROLATE PF 0.2 MG/ML IJ SOSY
PREFILLED_SYRINGE | INTRAMUSCULAR | Status: DC | PRN
  Administered 2018-12-27: .2 mg via INTRAVENOUS

## 2018-12-27 MED ORDER — PROMETHAZINE HCL 25 MG/ML IJ SOLN
6.2500 mg | INTRAMUSCULAR | Status: DC | PRN
  Administered 2018-12-27: 12:00:00 12.5 mg via INTRAVENOUS
  Filled 2018-12-27: qty 1

## 2018-12-27 MED ORDER — SUGAMMADEX SODIUM 200 MG/2ML IV SOLN
INTRAVENOUS | Status: DC | PRN
  Administered 2018-12-27: 170.8 mg via INTRAVENOUS

## 2018-12-27 MED ORDER — SODIUM CHLORIDE 0.9 % IV SOLN
INTRAVENOUS | Status: AC
  Administered 2018-12-27: 14:00:00 via INTRAVENOUS

## 2018-12-27 MED ORDER — VANCOMYCIN HCL IN DEXTROSE 1-5 GM/200ML-% IV SOLN
1000.0000 mg | Freq: Two times a day (BID) | INTRAVENOUS | Status: AC
  Administered 2018-12-27: 1000 mg via INTRAVENOUS
  Filled 2018-12-27: qty 200

## 2018-12-27 MED ORDER — LIDOCAINE HCL (CARDIAC) PF 100 MG/5ML IV SOSY
PREFILLED_SYRINGE | INTRAVENOUS | Status: DC | PRN
  Administered 2018-12-27: 60 mg via INTRAVENOUS

## 2018-12-27 MED ORDER — ALPRAZOLAM 0.5 MG PO TABS
0.5000 mg | ORAL_TABLET | Freq: Once | ORAL | Status: AC
  Administered 2018-12-27: 0.5 mg via ORAL
  Filled 2018-12-27: qty 1

## 2018-12-27 MED ORDER — BUPIVACAINE LIPOSOME 1.3 % IJ SUSP
INTRAMUSCULAR | Status: AC
  Filled 2018-12-27: qty 20

## 2018-12-27 MED ORDER — MIDAZOLAM HCL 2 MG/2ML IJ SOLN
0.5000 mg | Freq: Once | INTRAMUSCULAR | Status: DC | PRN
  Administered 2018-12-27: 12:00:00 0.5 mg via INTRAVENOUS
  Filled 2018-12-27: qty 2

## 2018-12-27 MED ORDER — METOCLOPRAMIDE HCL 10 MG PO TABS: 5.0000 mg | ORAL_TABLET | Freq: Three times a day (TID) | ORAL | Status: DC | PRN

## 2018-12-27 MED ORDER — MUPIROCIN 2 % EX OINT
1.0000 "application " | TOPICAL_OINTMENT | Freq: Two times a day (BID) | CUTANEOUS | Status: DC
  Administered 2018-12-27 – 2018-12-28 (×2): 1 via NASAL
  Filled 2018-12-27: qty 22

## 2018-12-27 MED ORDER — HYDROMORPHONE HCL 1 MG/ML IJ SOLN
1.0000 mg | INTRAMUSCULAR | Status: DC
  Administered 2018-12-27: 1 mg via INTRAVENOUS
  Filled 2018-12-27 (×2): qty 1

## 2018-12-27 MED ORDER — 0.9 % SODIUM CHLORIDE (POUR BTL) OPTIME
TOPICAL | Status: DC | PRN
  Administered 2018-12-27 (×2): 1000 mL

## 2018-12-27 MED ORDER — BUPIVACAINE-EPINEPHRINE (PF) 0.5% -1:200000 IJ SOLN
INTRAMUSCULAR | Status: AC
  Filled 2018-12-27: qty 60

## 2018-12-27 MED ORDER — ONDANSETRON HCL 4 MG/2ML IJ SOLN
4.0000 mg | Freq: Four times a day (QID) | INTRAMUSCULAR | Status: DC | PRN
  Administered 2018-12-27: 4 mg via INTRAVENOUS
  Filled 2018-12-27: qty 2

## 2018-12-27 MED ORDER — ONDANSETRON HCL 4 MG/2ML IJ SOLN
INTRAMUSCULAR | Status: AC
  Filled 2018-12-27: qty 2

## 2018-12-27 MED ORDER — PROPOFOL 10 MG/ML IV BOLUS
INTRAVENOUS | Status: AC
  Filled 2018-12-27: qty 20

## 2018-12-27 MED ORDER — CHLORHEXIDINE GLUCONATE 4 % EX LIQD
60.0000 mL | Freq: Once | CUTANEOUS | Status: AC
  Administered 2018-12-27: 4 via TOPICAL

## 2018-12-27 MED ORDER — MENTHOL 3 MG MT LOZG: 1.0000 | LOZENGE | OROMUCOSAL | Status: DC | PRN

## 2018-12-27 MED ORDER — DOCUSATE SODIUM 100 MG PO CAPS
100.0000 mg | ORAL_CAPSULE | Freq: Two times a day (BID) | ORAL | Status: DC
  Administered 2018-12-27: 14:00:00 100 mg via ORAL
  Filled 2018-12-27: qty 1

## 2018-12-27 MED ORDER — POVIDONE-IODINE 10 % EX SWAB
2.0000 "application " | Freq: Once | CUTANEOUS | Status: AC
  Administered 2018-12-27: 2 via TOPICAL

## 2018-12-27 MED ORDER — ROCURONIUM BROMIDE 100 MG/10ML IV SOLN
INTRAVENOUS | Status: DC | PRN
  Administered 2018-12-27: 20 mg via INTRAVENOUS
  Administered 2018-12-27: 50 mg via INTRAVENOUS

## 2018-12-27 SURGICAL SUPPLY — 58 items
APL SKNCLS STERI-STRIP NONHPOA (GAUZE/BANDAGES/DRESSINGS) ×1
BENZOIN TINCTURE PRP APPL 2/3 (GAUZE/BANDAGES/DRESSINGS) ×2 IMPLANT
BIT DRILL TWIST 3.5MM (BIT) ×1 IMPLANT
BLADE SURG SZ10 CARB STEEL (BLADE) ×6 IMPLANT
BNDG GAUZE ELAST 4 BULKY (GAUZE/BANDAGES/DRESSINGS) ×3 IMPLANT
CHLORAPREP W/TINT 26 (MISCELLANEOUS) ×3 IMPLANT
CLOSURE WOUND 1/2 X4 (GAUZE/BANDAGES/DRESSINGS) ×2
CLOTH BEACON ORANGE TIMEOUT ST (SAFETY) ×3 IMPLANT
COVER LIGHT HANDLE STERIS (MISCELLANEOUS) ×6 IMPLANT
COVER MAYO STAND XLG (MISCELLANEOUS) ×3 IMPLANT
COVER WAND RF STERILE (DRAPES) ×2 IMPLANT
DECANTER SPIKE VIAL GLASS SM (MISCELLANEOUS) ×5 IMPLANT
DRAPE STERI IOBAN 125X83 (DRAPES) ×3 IMPLANT
DRILL TWIST 3.5MM (BIT) ×3
DRSG MEPILEX BORDER 4X12 (GAUZE/BANDAGES/DRESSINGS) ×2 IMPLANT
ELECT REM PT RETURN 9FT ADLT (ELECTROSURGICAL) ×3
ELECTRODE REM PT RTRN 9FT ADLT (ELECTROSURGICAL) ×1 IMPLANT
GLOVE BIO SURGEON STRL SZ7 (GLOVE) ×2 IMPLANT
GLOVE BIOGEL PI IND STRL 7.0 (GLOVE) ×1 IMPLANT
GLOVE BIOGEL PI INDICATOR 7.0 (GLOVE) ×8
GLOVE ECLIPSE 6.5 STRL STRAW (GLOVE) ×2 IMPLANT
GLOVE SKINSENSE NS SZ8.0 LF (GLOVE) ×2
GLOVE SKINSENSE STRL SZ8.0 LF (GLOVE) ×1 IMPLANT
GLOVE SS N UNI LF 8.5 STRL (GLOVE) ×3 IMPLANT
GOWN STRL REUS W/TWL LRG LVL3 (GOWN DISPOSABLE) ×7 IMPLANT
GOWN STRL REUS W/TWL XL LVL3 (GOWN DISPOSABLE) ×3 IMPLANT
INST SET MAJOR BONE (KITS) ×3 IMPLANT
KIT BLADEGUARD II DBL (SET/KITS/TRAYS/PACK) ×5 IMPLANT
KIT TURNOVER CYSTO (KITS) ×3 IMPLANT
MANIFOLD NEPTUNE II (INSTRUMENTS) ×3 IMPLANT
MARKER SKIN DUAL TIP RULER LAB (MISCELLANEOUS) ×3 IMPLANT
NDL HYPO 21X1.5 SAFETY (NEEDLE) ×1 IMPLANT
NDL SPNL 18GX3.5 QUINCKE PK (NEEDLE) ×1 IMPLANT
NEEDLE HYPO 21X1.5 SAFETY (NEEDLE) ×3 IMPLANT
NEEDLE SPNL 18GX3.5 QUINCKE PK (NEEDLE) ×3 IMPLANT
NS IRRIG 1000ML POUR BTL (IV SOLUTION) ×5 IMPLANT
PACK BASIC III (CUSTOM PROCEDURE TRAY) ×3
PACK SRG BSC III STRL LF ECLPS (CUSTOM PROCEDURE TRAY) ×1 IMPLANT
PAD ABD 5X9 TENDERSORB (GAUZE/BANDAGES/DRESSINGS) ×3 IMPLANT
PAD ARMBOARD 7.5X6 YLW CONV (MISCELLANEOUS) ×3 IMPLANT
PENCIL HANDSWITCHING (ELECTRODE) ×3 IMPLANT
PLATE BONE CLASSIC 135 3H (Plate) ×2 IMPLANT
SCREW COMPRESSION 19.0M (Screw) ×2 IMPLANT
SCREW CORTICAL SFTP 4.5X36MM (Screw) ×4 IMPLANT
SCREW CORTICAL SFTP 4.5X38MM (Screw) ×2 IMPLANT
SCREW LAG 90MM (Screw) ×3 IMPLANT
SCREW LAGSTD 90X21X12.7X9 (Screw) IMPLANT
SET BASIN LINEN APH (SET/KITS/TRAYS/PACK) ×3 IMPLANT
SPONGE LAP 18X18 RF (DISPOSABLE) ×10 IMPLANT
STAPLER VISISTAT 35W (STAPLE) ×3 IMPLANT
STRIP CLOSURE SKIN 1/2X4 (GAUZE/BANDAGES/DRESSINGS) ×2 IMPLANT
SUT BRALON NAB BRD #1 30IN (SUTURE) ×4 IMPLANT
SUT MNCRL 0 VIOLET CTX 36 (SUTURE) ×1 IMPLANT
SUT MON AB 2-0 CT1 36 (SUTURE) ×2 IMPLANT
SUT MONOCRYL 0 CTX 36 (SUTURE) ×4
SYR 20ML LL LF (SYRINGE) ×6 IMPLANT
SYR BULB IRRIGATION 50ML (SYRINGE) ×5 IMPLANT
TRAY FOLEY MTR SLVR 16FR STAT (SET/KITS/TRAYS/PACK) ×3 IMPLANT

## 2018-12-27 NOTE — Progress Notes (Signed)
Patient is tearful and anxious. Patient states her pain is 10/10. Patient has expressed emotional stress in her life. Patient continually expressing her worry for her children while she is at the hospital.

## 2018-12-27 NOTE — Consult Note (Signed)
[name]   Patient ID: Mallory Mitchell, female   DOB: [birth year], 33 y.o.   MRN: [id number]  New patient  Requested by: Dr Joesph Fillers  Reason for: RIGHT HIP FRACTURE   Based on the information below I recommend ORIF RT HIP  Chief Complaint  Patient presents with  . Leg Pain     HPI  33 year old female with history of opioid addiction currently on methadone followed at the [facility] clinic fell sometime around September 24 came to the emergency room had x-rays of her hip and back as she was complaining of back and hip pain x-rays were read as negative she was sent back home however as the day progressed she had more difficulty with the right hip with increasing pain and then she could not stand or walk and was brought back to the emergency room on September 30 CT scan showed a fracture of the base of the cervical neck of the right hip Location PAIN RIGHT HIP Duration 12/20/2018 Severity 10 Quality DULL ACHE Modified by [place], ASS[facility] W BACK AND THIGH PAIN   Review of Systems (all) Review of Systems  Constitutional: Negative for chills, fever and weight loss.  HENT: Negative.   Respiratory: Negative for shortness of breath.   Cardiovascular: Negative for chest pain.  Musculoskeletal: Positive for back pain, falls and joint pain.  Skin: Negative.   Psychiatric/Behavioral:       History of substance abuse, currently followed at the methadone clinic in [place] takes 60 mg of methadone every morning  All other systems reviewed and are negative.   Past Medical History:  Diagnosis Date  . Depression   . Myasthenia gravis ([place])   . Seizures ([place])     Past Surgical History:  Procedure Laterality Date  . MOUTH SURGERY      Family History  Problem Relation Age of Onset  . Liver cancer Mother    Social History   Tobacco Use  . Smoking status: Current Some Day Smoker    Packs/day: 0.50    Types: Cigarettes  . Smokeless  tobacco: Never Used  Substance Use Topics  . Alcohol use: Yes    Comment: occ  . Drug use: Yes    Types: Cocaine, Marijuana    Comment: Denies   Mother of 66 63-year-old and a 7-year-old   No Known Allergies  Current Facility-Administered Medications:  .  0.9 %  sodium chloride infusion, , Intravenous, Continuous, Jani Gravel, MD, Last Rate: 75 mL/hr at 12/27/18 0348 .  acetaminophen (TYLENOL) tablet 650 mg, 650 mg, Oral, Q6H PRN **OR** acetaminophen (TYLENOL) suppository 650 mg, 650 mg, Rectal, Q6H PRN, Jani Gravel, MD .  ceFAZolin (ANCEF) IVPB 2g/100 mL premix, 2 g, Intravenous, On Call to OR, Carole Civil, MD .  Chlorhexidine Gluconate Cloth 2 % PADS 6 each, 6 each, Topical, Q0600, Emokpae, Courage, MD .  gabapentin (NEURONTIN) tablet 600 mg, 600 mg, Oral, TID, Jani Gravel, MD .  HYDROmorphone (DILAUDID) injection 1 mg, 1 mg, Intravenous, Q2H, Carole Civil, MD .  methadone (DOLOPHINE) 10 MG/ML solution 60 mg, 60 mg, Oral, q morning - 10a, Jani Gravel, MD .  mupirocin ointment (BACTROBAN) 2 % 1 application, 1 application, Nasal, BID, Jani Gravel, MD, 1 application at [id number] .  mupirocin ointment (BACTROBAN) 2 % 1 application, 1 application, Nasal, BID, Emokpae, Courage, MD .  ondansetron (ZOFRAN) injection 4 mg, 4 mg, Intravenous, Q6H PRN, Jani Gravel, MD .  predniSONE (DELTASONE)  tablet 20 mg, 20 mg, Oral, Q breakfast, Jani Gravel, MD .  pyridostigmine (MESTINON) tablet 30 mg, 30 mg, Oral, TID, Jani Gravel, MD    Physical Exam(=30) BP 122/82 (BP Location: Right Arm)   Pulse 74   Temp 98.9 F (37.2 C) (Oral)   Resp 16   Ht 5\' 3"  (1.6 m)   Wt 85.4 kg   SpO2 100%   BMI 33.35 kg/m   Gen. Appearance normal development, mesomorphic body habitus Peripheral vascular system normal color capillary refill pulses 2+ equal Lymph nodes negative right groin Gait unable to weight-bear or walk on the right leg  Left Upper extremity  Inspection revealed no malalignment or  asymmetry  Assessment of range of motion: Full range of motion was recorded  Assessment of stability: Elbow wrist and hand and shoulder were stable  Assessment of muscle strength and tone revealed grade 5 muscle strength and normal muscle tone  Skin was normal without rash lesion or ulceration  Right upper extremity  Inspection revealed no malalignment or asymmetry  Assessment of range of motion: Full range of motion was recorded  Assessment of stability: Elbow wrist and hand and shoulder were stable  Assessment of muscle strength and tone revealed grade 5 muscle strength and normal muscle tone  Skin was normal without rash lesion or ulceration  Right Lower extremity  Inspection mild external rotation of the right lower extremity  Ankle motion is normal knee and hip motion deferred because of pain and fracture  Knee is stable ankle is stable  Muscle tone is normal no tremor normal dorsiflexion plantarflexion strength  Skin normal no rash or lesion ulceration or erythema   LEFT  lower extremity Inspection revealed no malalignment or asymmetry  Assessment of range of motion: Full range of motion was recorded  Assessment of stability: Ankle, knee and hip were stable  Assessment of muscle strength and tone revealed grade 5 muscle strength and normal muscle tone  Skin was normal without rash lesion or ulceration  Coordination was tested by finger-to-nose nose and was normal Deep tendon reflexes were 2+ in the upper extremities Examination of sensation by touch was normal  Mental status  Oriented to time person and place normal  Mood and affect normal without depression anxiety or agitation  Dx: Right hip fracture, fractures at the base of the neck of the right femur intratrochanteric fracture equivalent  Data Reviewed  ER RECORD REVIEWED: CONFIRMS HISTORY   I reviewed the following images and the reports and my independent interpretation is right hip fracture at the base of the  neck, essentially an intertrochanteric fracture   Assessment  RIGHT HIP FRACTURE NON DISPLACED  Plan   ORIF RIGHT HIP WITH COMPRESSION SCREW AND SIDE PLATE   The procedure has been fully reviewed with the patient; The risks and benefits of surgery have been discussed and explained and understood. Alternative treatment has also been reviewed, questions were encouraged and answered. The postoperative plan is also been reviewed.    Carole Civil MD

## 2018-12-27 NOTE — Brief Op Note (Signed)
12/26/2018 - 12/27/2018  11:34 AM  PATIENT:  Mallory Mitchell  33 y.o. female  PRE-OPERATIVE DIAGNOSIS:  right hip fracture  POST-OPERATIVE DIAGNOSIS:  right hip fracture  PROCEDURE:  Procedure(s): OPEN REDUCTION INTERNAL FIXATION RIGHT HIP (Right)  Findings at surgery nondisplaced basicervical femoral neck fracture (intertrochanteric equivalent)  Implants: [employer] 3-hole sideplate classic [id number] degree, 90 mm lag screw with 345 screws and the plate and one compression screw   SURGEON:  Surgeon(s) and Role:    Carole Civil, MD - Primary   The patient was seen in preop and the surgical site was confirmed right hip was marked  Patient was taken to surgery for general anesthesia  She was placed on the fracture table in supine position with a perineal post after Foley catheter was inserted  She was placed in well leg holder with abduction flexion of the left hip and then the right leg was placed in a traction device  I brought the C arm in and took x-rays the fracture was essentially nondisplaced  The leg was then prepped and draped sterilely and timeout was completed  A longitudinal incision was made starting at the trochanter and extended distally subcutaneous tissue was divided  The fascia was then split in line with the skin incision  The vastus lateralis muscle was lifted anteriorly and the fascia was split subperiosteal dissection and anterior retraction expose the proximal femur electrocautery was used to control perforating bleeders  A perforating drill was used to open the lateral femoral canal 135 degree guide was placed and the pin was placed in the center of the femoral head  This pin measured 95 mm we set the reamer for 90 mm and reamed over the guidewire  The 135 degree plate was then placed over the lag screw after it was placed and then the plate was secured to bone and then 3, 4.5 millimeter screws were placed using AO technique with the first screw  placed in compression with the traction released  Radiographs confirm hardware position to be excellent with an excellent tip to apex distance  The compression screw was then placed  The wound was irrigated and closed with 0 Monocryl for the vastus lateralis fascia, #1 Braylon for the regular fascia, 0 Monocryl deep layer and then 2-0 Monocryl subcuticular closure with Steri-Strips and benzoin  Sterile dressing was applied  The patient was taken to the recovery room in stable condition   Postoperative plan She is weightbearing as tolerated Resume methadone as soon as possible 60 mg daily The wound can be checked at 2 weeks The hip can be x-rayed at 6 and 12 weeks Follow-up in 2 weeks DVT prevention for 35 days   Assisted by Zoila Shutter   General anesthesia   200 cc blood loss  BLOOD ADMINISTERED:none  DRAINS: none   LOCAL MEDICATIONS USED: 20 cc of Marcaine 20 cc of full-strength exparel   SPECIMEN:  No Specimen  DISPOSITION OF SPECIMEN:  N/A  COUNTS:  YES  TOURNIQUET:  * No tourniquets in log *  DICTATION: .Dragon Dictation  PLAN OF CARE: Admit to inpatient   PATIENT DISPOSITION:  PACU - hemodynamically stable.   Delay start of Pharmacological VTE agent (>24hrs) due to surgical blood loss or risk of bleeding: no

## 2018-12-27 NOTE — Progress Notes (Signed)
Patient Demographics:    Mallory Mitchell, is a 33 y.o. female, DOB - [birth year], [id number]  Admit date - 12/26/2018   Admitting Physician Jani Gravel, MD  Outpatient Primary MD for the patient is Patient, No Pcp Per  LOS - 1   Chief Complaint  Patient presents with  . Leg Pain        Subjective:    Mallory Mitchell today has no fevers, no emesis,  No chest pain, planes of right hip pain  Assessment  & Plan :    Principal Problem:   Femoral neck fracture [facility])  Brief Summary 33 y.o. female, w myasthenia gravis (new diagnosis), seizure do , opiate dependence on methadone , depression, on chronic prednisone for the past few months c/o fall , mechanical somewhere around 12/16/18 or 12/18/18 apparently presented to ER due to pain on 12/20/2018 w negative xray , CT of the right hip: 12/26/2018 with right femoral fracture, status post ORIF on 12/27/2018  A/p 1)Rt Hip Fx- status post ORIF on 12/27/2018 WBAT- -outpatient Ortho follow-up in 2 weeks -DVT prophylaxis for 35 days   2) opioid dependence--- continue methadone 60 mg daily, continue gabapentin  3)MG--stable, continue prednisone 20 mg daily and pyridostigmine 30 mg tid  Disposition/Need for in-Hospital Stay- patient unable to be discharged at this time due to --ORIF--awaiting physical therapy eval postop*  Code Status : Full  Family Communication:   NA (patient is alert, awake and coherent)  Disposition Plan  : TBD  Consults  :  ortho  DVT Prophylaxis  :  Lovenox -  - SCDs   Lab Results  Component Value Date   PLT 489 (H) 12/27/2018   Inpatient Medications  Scheduled Meds: . [START ON 12/28/2018] aspirin EC  325 mg Oral Q breakfast  . celecoxib  200 mg Oral BID  . Chlorhexidine Gluconate Cloth  6 each Topical Q0600  . docusate sodium  100 mg Oral BID  . gabapentin  600 mg Oral TID  . methadone  60 mg Oral q morning - 10a  . mupirocin  ointment  1 application Nasal BID  . mupirocin ointment  1 application Nasal BID  . predniSONE  20 mg Oral Q breakfast  . pyridostigmine  30 mg Oral TID   Continuous Infusions: . sodium chloride 75 mL/hr at 12/27/18 1337   PRN Meds:.acetaminophen **OR** acetaminophen, HYDROmorphone (DILAUDID) injection, menthol-cetylpyridinium **OR** phenol, metoCLOPramide **OR** metoCLOPramide (REGLAN) injection, ondansetron (ZOFRAN) IV, ondansetron **OR** ondansetron (ZOFRAN) IV, oxyCODONE, senna-docusate    Anti-infectives (From admission, onward)   Start     Dose/Rate Route Frequency Ordered Stop   12/27/18 1315  vancomycin (VANCOCIN) IVPB 1000 mg/200 mL premix     1,000 mg 200 mL/hr over 60 Minutes Intravenous Every 12 hours 12/27/18 1301 12/27/18 1438   12/27/18 0845  vancomycin (VANCOCIN) 1,500 mg in sodium chloride 0.9 % 500 mL IVPB     1,500 mg 250 mL/hr over 120 Minutes Intravenous  Once 12/27/18 0844 12/27/18 1030   12/27/18 0600  ceFAZolin (ANCEF) IVPB 2g/100 mL premix  Status:  Discontinued     2 g 200 mL/hr over 30 Minutes Intravenous On call to O.R. 12/27/18 0057 12/27/18 0848        Objective:   Vitals:  12/27/18 1215 12/27/18 1230 12/27/18 1245 12/27/18 1312  BP: 128/87 108/86 121/72 133/84  Pulse: 86 72 68 78  Resp: 20 15 17 18   Temp:   97.7 F (36.5 C) 98.3 F (36.8 C)  TempSrc:    Oral  SpO2: 100% 100% 98% 99%  Weight:      Height:        Wt Readings from Last 3 Encounters:  12/26/18 85.4 kg  12/20/18 83.9 kg  12/08/15 59 kg     Intake/Output Summary (Last 24 hours) at 12/27/2018 1735 Last data filed at 12/27/2018 1200 Gross per 24 hour  Intake 2000.91 ml  Output 1935 ml  Net 65.91 ml    Physical Exam  Gen:- Awake Alert,  In no apparent distress  HEENT:- [place].AT, No sclera icterus Neck-Supple Neck,No JVD,.  Lungs-  CTAB , fair symmetrical air movement CV- S1, S2 normal, regular  Abd-  +ve B.Sounds, Abd Soft, No tenderness,    Extremity/Skin:- No   edema, pedal pulses present  Psych-affect is appropriate, oriented x3 Neuro-no new focal deficits, no tremors MSK--right hip postop wound clean dry intact   Data Review:   Micro Results Recent Results (from the past 240 hour(s))  SARS Coronavirus 2 [facility] order, Performed in [facility] hospital lab) Nasopharyngeal Nasopharyngeal Swab     Status: None   Collection Time: 12/26/18  7:39 PM   Specimen: Nasopharyngeal Swab  Result Value Ref Range Status   SARS Coronavirus 2 NEGATIVE NEGATIVE Final    Comment: (NOTE) If result is NEGATIVE SARS-CoV-2 target nucleic acids are NOT DETECTED. The SARS-CoV-2 RNA is generally detectable in upper and lower  respiratory specimens during the acute phase of infection. The lowest  concentration of SARS-CoV-2 viral copies this assay can detect is 250  copies / mL. A negative result does not preclude SARS-CoV-2 infection  and should not be used as the sole basis for treatment or other  patient management decisions.  A negative result may occur with  improper specimen collection / handling, submission of specimen other  than nasopharyngeal swab, presence of viral mutation(s) within the  areas targeted by this assay, and inadequate number of viral copies  (<250 copies / mL). A negative result must be combined with clinical  observations, patient history, and epidemiological information. If result is POSITIVE SARS-CoV-2 target nucleic acids are DETECTED. The SARS-CoV-2 RNA is generally detectable in upper and lower  respiratory specimens dur ing the acute phase of infection.  Positive  results are indicative of active infection with SARS-CoV-2.  Clinical  correlation with patient history and other diagnostic information is  necessary to determine patient infection status.  Positive results do  not rule out bacterial infection or co-infection with other viruses. If result is PRESUMPTIVE POSTIVE SARS-CoV-2 nucleic acids MAY BE PRESENT.   A  presumptive positive result was obtained on the submitted specimen  and confirmed on repeat testing.  While 2019 novel coronavirus  (SARS-CoV-2) nucleic acids may be present in the submitted sample  additional confirmatory testing may be necessary for epidemiological  and / or clinical management purposes  to differentiate between  SARS-CoV-2 and other Sarbecovirus currently known to infect humans.  If clinically indicated additional testing with an alternate test  methodology [phone]) is advised. The SARS-CoV-2 RNA is generally  detectable in upper and lower respiratory sp ecimens during the acute  phase of infection. The expected result is Negative. Fact Sheet for Patients:  StrictlyIdeas.no Fact Sheet for Healthcare Providers: BankingDealers.co.za This test  is not yet approved or cleared by the [place] and has been authorized for detection and/or diagnosis of SARS-CoV-2 by FDA under an Emergency Use Authorization (EUA).  This EUA will remain in effect (meaning this test can be used) for the duration of the COVID-19 declaration under Section 564(b)(1) of the Act, 21 U.S.C. section 360bbb-3(b)(1), unless the authorization is terminated or revoked sooner. Performed at [facility], [address] [place], [place] [address]   Surgical PCR screen     Status: Abnormal   Collection Time: 12/27/18 12:02 AM   Specimen: Nasal Mucosa; Nasal Swab  Result Value Ref Range Status   MRSA, PCR POSITIVE (A) NEGATIVE Final    Comment: RESULT CALLED TO, READ BACK BY AND VERIFIED WITH: B. JOHNSON,RN @0654   10/01/2020KAY    Staphylococcus aureus POSITIVE (A) NEGATIVE Final    Comment: (NOTE) The Xpert SA Assay (FDA approved for NASAL specimens in patients 53 years of age and older), is one component of a comprehensive surveillance program. It is not intended to diagnose infection nor to guide or monitor treatment. Performed at [facility], [address], [place], [place] [address]     Radiology Reports Dg Lumbar Spine Complete  Result Date: 12/20/2018 CLINICAL DATA:  Low back pain with right-sided sciatica. Recent fall EXAM: LUMBAR SPINE - COMPLETE 4+ VIEW COMPARISON:  None. FINDINGS: Frontal, lateral, spot lumbosacral lateral, and bilateral oblique views were obtained. There are 5 non-rib-bearing lumbar type vertebral bodies. There is no fracture or spondylolisthesis. There is moderate disc space narrowing at L3-4, L4-5, and L5-S1. There is no appreciable facet arthropathy. IMPRESSION: Disc space narrowing at L3-4, L4-5, and L5-S1. No fracture or spondylolisthesis. Electronically Signed   By: Lowella Grip III M.D.   On: 12/20/2018 15:59   Ct Lumbar Spine Wo Contrast  Result Date: 12/26/2018 CLINICAL DATA:  33 year old female with low back pain radiating down right hip. EXAM: CT LUMBAR SPINE WITHOUT CONTRAST TECHNIQUE: Multidetector CT imaging of the lumbar spine was performed without intravenous contrast administration. Multiplanar CT image reconstructions were also generated. COMPARISON:  Lumbar spine radiograph dated 12/20/2018 FINDINGS: Segmentation: 5 lumbar type vertebrae. Alignment: Normal. Vertebrae: No acute fracture or focal pathologic process. Paraspinal and other soft tissues: Negative. Disc levels: Unremarkable. No degenerative changes. The neural foramina are patent. Probable old healed left posterior twelfth rib fracture. IMPRESSION: No acute/traumatic lumbar spine pathology. No degenerative changes or neural foramina stenosis. Electronically Signed   By: Anner Crete M.D.   On: 12/26/2018 19:00   Ct Pelvis Wo Contrast  Result Date: 12/26/2018 CLINICAL DATA:  Right-sided hip pain with negative films, subsequent encounter EXAM: CT PELVIS WITHOUT CONTRAST TECHNIQUE: Multidetector CT imaging of the pelvis was performed following the standard protocol without intravenous contrast. COMPARISON:  12/20/2018 FINDINGS:  Urinary Tract: Bladder is partially distended. Visualized kidneys appear within normal limits. Bowel: Appendix is unremarkable. No specific bowel abnormality is noted. Vascular/Lymphatic: No pathologically enlarged lymph nodes. No significant vascular abnormality seen. Reproductive: Uterus appears within normal limits. No ovarian lesions are seen. Other:  No free fluid is noted. Musculoskeletal: There is an undisplaced fracture at the base of the right femoral neck without significant extension in the intratrochanteric region. Acetabula are within normal limits. No other fracture is seen. IMPRESSION: Undisplaced fracture at the base of the right femoral neck without significant extension into the intratrochanteric region. No other focal abnormality is noted. Electronically Signed   By: Inez Catalina M.D.   On: 12/26/2018 18:55  Dg Chest Port 1 View  Result Date: 12/26/2018 CLINICAL DATA:  Preop right hip surgery. EXAM: PORTABLE CHEST 1 VIEW COMPARISON:  Radiograph 11/29/2011 FINDINGS: The cardiomediastinal contours are normal. The lungs are clear. Pulmonary vasculature is normal. No consolidation, pleural effusion, or pneumothorax. No acute osseous abnormalities are seen. IMPRESSION: Negative AP view of the chest. Electronically Signed   By: Keith Rake M.D.   On: 12/26/2018 22:20   Dg Hip Operative Unilat With Pelvis Right  Result Date: 12/27/2018 CLINICAL DATA:  ORIF right hip EXAM: OPERATIVE right HIP (WITH PELVIS IF PERFORMED) AP and lateral VIEWS TECHNIQUE: Fluoroscopic spot image(s) were submitted for interpretation post-operatively. COMPARISON:  Preoperative assessment 12/26/2018 FINDINGS: Seven total images are submitted, initial AP and lateral images of the right hip. Subsequently images interval ORIF with dynamic hip screw. No hardware complication is noted on limited images. Alignment preserved. Right hip located. IMPRESSION: Post ORIF right hip no immediate complicating features.  Electronically Signed   By: Zetta Bills M.D.   On: 12/27/2018 11:30   Dg Hip Unilat W Or Wo Pelvis 2-3 Views Right  Result Date: 12/26/2018 CLINICAL DATA:  33 year old female with fall and right hip pain. EXAM: DG HIP (WITH OR WITHOUT PELVIS) 2-3V RIGHT COMPARISON:  Right hip radiograph dated 12/20/2018 FINDINGS: Nondisplaced basicervical fracture of the right femoral neck. There is no dislocation. The bones are somewhat osteopenic for age. The soft tissues are unremarkable. IMPRESSION: Nondisplaced fracture of the right femoral neck. No dislocation. Electronically Signed   By: Anner Crete M.D.   On: 12/26/2018 21:23   Dg Hip Unilat W Or Wo Pelvis 2-3 Views Right  Result Date: 12/20/2018 CLINICAL DATA:  Low back pain, chronic worsened after 2 days. EXAM: DG HIP (WITH OR WITHOUT PELVIS) 2-3V RIGHT COMPARISON:  Lumbar spine of the same date. FINDINGS: AP view of the pelvis and two views of the right hip show no signs of fracture or dislocation. Soft tissues are normal. IMPRESSION: Negative. Electronically Signed   By: Zetta Bills M.D.   On: 12/20/2018 15:59     CBC Recent Labs  Lab 12/26/18 1952 12/27/18 0449  WBC 18.1* 19.8*  HGB 11.9* 12.0  HCT 37.8 38.7  PLT 469* 489*  MCV 84.6 86.4  MCH 26.6 26.8  MCHC 31.5 31.0  RDW 14.9 15.0  LYMPHSABS 4.2*  --   MONOABS 1.1*  --   EOSABS 0.0  --   BASOSABS 0.1  --     Chemistries  Recent Labs  Lab 12/26/18 1952 12/27/18 0449  NA 135 137  K 4.1 4.2  CL 100 100  CO2 27 27  GLUCOSE 86 98  BUN 16 19  CREATININE 0.51 0.55  CALCIUM 9.0 9.0  AST  --  12*  ALT  --  16  ALKPHOS  --  55  BILITOT  --  0.5   ------------------------------------------------------------------------------------------------------------------ No results for input(s): CHOL, HDL, LDLCALC, TRIG, CHOLHDL, LDLDIRECT in the last 72 hours.  No results found for: HGBA1C  ------------------------------------------------------------------------------------------------------------------ No results for input(s): TSH, T4TOTAL, T3FREE, THYROIDAB in the last 72 hours.  Invalid input(s): FREET3 ------------------------------------------------------------------------------------------------------------------ No results for input(s): VITAMINB12, FOLATE, FERRITIN, TIBC, IRON, RETICCTPCT in the last 72 hours.  Coagulation profile No results for input(s): INR, PROTIME in the last 168 hours.  No results for input(s): DDIMER in the last 72 hours.  Cardiac Enzymes No results for input(s): CKMB, TROPONINI, MYOGLOBIN in the last 168 hours.  Invalid input(s): CK ------------------------------------------------------------------------------------------------------------------ No results found for: BNP  Roxan Hockey M.D on 12/27/2018 at 5:35 PM  Go to [email or web address] - for contact info  [facility] - Office  [phone]

## 2018-12-27 NOTE — Anesthesia Postprocedure Evaluation (Signed)
Anesthesia Post Note  Patient: Mallory Mitchell  Procedure(s) Performed: OPEN REDUCTION INTERNAL FIXATION RIGHT HIP (Right Hip)  Patient location during evaluation: PACU Anesthesia Type: General Level of consciousness: awake and alert and patient cooperative Pain management: satisfactory to patient Vital Signs Assessment: post-procedure vital signs reviewed and stable Respiratory status: spontaneous breathing Cardiovascular status: stable Postop Assessment: no apparent nausea or vomiting Anesthetic complications: no     Last Vitals:  Vitals:   12/27/18 1245 12/27/18 1312  BP: 121/72 133/84  Pulse: 68 78  Resp: 17 18  Temp: 36.5 C 36.8 C  SpO2: 98% 99%    Last Pain:  Vitals:   12/27/18 1312  TempSrc: Oral  PainSc:                  Drucie Opitz

## 2018-12-27 NOTE — Anesthesia Preprocedure Evaluation (Signed)
Anesthesia Evaluation  Patient identified by MRN, date of birth, ID band Patient awake    Reviewed: Allergy & Precautions, NPO status , Patient's Chart, lab work & pertinent test results  Airway Mallampati: II  TM Distance: >3 FB Neck ROM: Full    Dental no notable dental hx. (+) Poor Dentition, Chipped, Missing   Pulmonary neg pulmonary ROS, Current Smoker and Patient abstained from smoking.,    Pulmonary exam normal breath sounds clear to auscultation       Cardiovascular Exercise Tolerance: Good negative cardio ROS Normal cardiovascular examI Rhythm:Regular Rate:Normal     Neuro/Psych Seizures -, Well Controlled,  PSYCHIATRIC DISORDERS Depression States had epilepsy -states changed her ways -now off Sz meds -states no Sz in over 2 years   Pt with known MG on prednisone and [place]  Minimally symptomatic/States can't tell when she takes meds  Neuromuscular disease    GI/Hepatic negative GI ROS, (+)     substance abuse  , Known Methadone user  Pt denies IVDA ever  States has NO Cocaine in her system for over a month    Endo/Other  negative endocrine ROS  Renal/GU negative Renal ROS  negative genitourinary   Musculoskeletal negative musculoskeletal ROS (+) narcotic dependent  Abdominal   Peds negative pediatric ROS (+)  Hematology negative hematology ROS (+)   Anesthesia Other Findings   Reproductive/Obstetrics negative OB ROS                             Anesthesia Physical Anesthesia Plan  ASA: III and emergent  Anesthesia Plan: General   Post-op Pain Management:    Induction: Intravenous  PONV Risk Score and Plan: 2 and Midazolam, Ondansetron, Dexamethasone and Treatment may vary due to age or medical condition  Airway Management Planned: Oral ETT  Additional Equipment:   Intra-op Plan:   Post-operative Plan: Extubation in OR  Informed Consent: I have reviewed  the patients History and Physical, chart, labs and discussed the procedure including the risks, benefits and alternatives for the proposed anesthesia with the patient or authorized representative who has indicated his/her understanding and acceptance.     Dental advisory given  Plan Discussed with: CRNA  Anesthesia Plan Comments: (Plan Full PPE use Plan GETA D/W PT -WTP with same after Q&A)        Anesthesia Quick Evaluation

## 2018-12-27 NOTE — Transfer of Care (Signed)
Immediate Anesthesia Transfer of Care Note  Patient: DICY VANGEL  Procedure(s) Performed: OPEN REDUCTION INTERNAL FIXATION RIGHT HIP (Right Hip)  Patient Location: PACU  Anesthesia Type:General  Level of Consciousness: awake, alert , oriented and patient cooperative  Airway & Oxygen Therapy: Patient Spontanous Breathing and Patient connected to face mask oxygen  Post-op Assessment: Report given to RN and Post -op Vital signs reviewed and stable  Post vital signs: Reviewed and stable  Last Vitals:  Vitals Value Taken Time  BP 121/87 12/27/18 1136  Temp    Pulse 111 12/27/18 1139  Resp 23 12/27/18 1139  SpO2 100 % 12/27/18 1139  Vitals shown include unvalidated device data.  Last Pain:  Vitals:   12/27/18 0809  TempSrc: Oral  PainSc: 9       Patients Stated Pain Goal: 6 ([id number])  Complications: No apparent anesthesia complications

## 2018-12-27 NOTE — Op Note (Signed)
12/27/2018  11:34 AM  PATIENT:  Mallory Mitchell  33 y.o. female  PRE-OPERATIVE DIAGNOSIS:  right hip fracture  POST-OPERATIVE DIAGNOSIS:  right hip fracture  PROCEDURE:  Procedure(s): OPEN REDUCTION INTERNAL FIXATION RIGHT HIP (Right)  Findings at surgery nondisplaced basicervical femoral neck fracture (intertrochanteric equivalent)  Implants: [employer] 3-hole sideplate classic 579FGE degree, 90 mm lag screw with 345 screws and the plate and one compression screw   SURGEON:  Surgeon(s) and Role:    Carole Civil, MD - Primary   The patient was seen in preop and the surgical site was confirmed right hip was marked  Patient was taken to surgery for general anesthesia  She was placed on the fracture table in supine position with a perineal post after Foley catheter was inserted  She was placed in well leg holder with abduction flexion of the left hip and then the right leg was placed in a traction device  I brought the C arm in and took x-rays the fracture was essentially nondisplaced  The leg was then prepped and draped sterilely and timeout was completed  A longitudinal incision was made starting at the trochanter and extended distally subcutaneous tissue was divided  The fascia was then split in line with the skin incision  The vastus lateralis muscle was lifted anteriorly and the fascia was split subperiosteal dissection and anterior retraction expose the proximal femur electrocautery was used to control perforating bleeders  A perforating drill was used to open the lateral femoral canal 135 degree guide was placed and the pin was placed in the center of the femoral head  This pin measured 95 mm we set the reamer for 90 mm and reamed over the guidewire  The 135 degree plate was then placed over the lag screw after it was placed and then the plate was secured to bone and then 3, 4.5 millimeter screws were placed using AO technique with the first screw placed in  compression with the traction released  Radiographs confirm hardware position to be excellent with an excellent tip to apex distance  The compression screw was then placed  The wound was irrigated and closed with 0 Monocryl for the vastus lateralis fascia, #1 Braylon for the regular fascia, 0 Monocryl deep layer and then 2-0 Monocryl subcuticular closure with Steri-Strips and benzoin  Sterile dressing was applied  The patient was taken to the recovery room in stable condition   Postoperative plan She is weightbearing as tolerated Resume methadone as soon as possible 60 mg daily The wound can be checked at 2 weeks The hip can be x-rayed at 6 and 12 weeks Follow-up in 2 weeks DVT prevention for 35 days   Assisted by Zoila Shutter   General anesthesia   200 cc blood loss  BLOOD ADMINISTERED:none  DRAINS: none   LOCAL MEDICATIONS USED: 20 cc of Marcaine 20 cc of full-strength exparel   SPECIMEN:  No Specimen  DISPOSITION OF SPECIMEN:  N/A  COUNTS:  YES  TOURNIQUET:  * No tourniquets in log *  DICTATION: .Dragon Dictation  PLAN OF CARE: Admit to inpatient   PATIENT DISPOSITION:  PACU - hemodynamically stable.   Delay start of Pharmacological VTE agent (>24hrs) due to surgical blood loss or risk of bleeding: no

## 2018-12-27 NOTE — Progress Notes (Signed)
CRITICAL VALUE ALERT  Critical Value: mrsa +   Date & Time Notied: 12-27-2018 0700  Provider Notified:    Orders Received/Actions taken:followed mrsa protocol

## 2018-12-27 NOTE — H&P (View-Only) (Signed)
[place]   Patient ID: Mallory Mitchell, female   DOB: April 22, 1985, 33 y.o.   MRN: [id number]  New patient  Requested by: Dr Joesph Fillers  Reason for: RIGHT HIP FRACTURE   Based on the information below I recommend ORIF RT HIP  Chief Complaint  Patient presents with  . Leg Pain     HPI  33 year old female with history of opioid addiction currently on methadone followed at the [facility] fell sometime around September 24 came to the emergency room had x-rays of her hip and back as she was complaining of back and hip pain x-rays were read as negative she was sent back home however as the day progressed she had more difficulty with the right hip with increasing pain and then she could not stand or walk and was brought back to the emergency room on September 30 CT scan showed a fracture of the base of the cervical neck of the right hip Location PAIN RIGHT HIP Duration 12/20/2018 Severity 10 Quality DULL ACHE Modified by [place], ASSOC W BACK AND THIGH PAIN   Review of Systems (all) Review of Systems  Constitutional: Negative for chills, fever and weight loss.  HENT: Negative.   Respiratory: Negative for shortness of breath.   Cardiovascular: Negative for chest pain.  Musculoskeletal: Positive for back pain, falls and joint pain.  Skin: Negative.   Psychiatric/Behavioral:       History of substance abuse, currently followed at the methadone clinic in [place] takes 60 mg of methadone every morning  All other systems reviewed and are negative.   Past Medical History:  Diagnosis Date  . Depression   . Myasthenia gravis ([place])   . Seizures ([place])     Past Surgical History:  Procedure Laterality Date  . MOUTH SURGERY      Family History  Problem Relation Age of Onset  . Liver cancer Mother    Social History   Tobacco Use  . Smoking status: Current Some Day Smoker    Packs/day: 0.50    Types: Cigarettes  . Smokeless  tobacco: Never Used  Substance Use Topics  . Alcohol use: Yes    Comment: occ  . Drug use: Yes    Types: Cocaine, Marijuana    Comment: Denies   Mother of 23 74-year-old and a 36-year-old   No Known Allergies  Current Facility-Administered Medications:  .  0.9 %  sodium chloride infusion, , Intravenous, Continuous, Jani Gravel, MD, Last Rate: 75 mL/hr at 12/27/18 0348 .  acetaminophen (TYLENOL) tablet 650 mg, 650 mg, Oral, Q6H PRN **OR** acetaminophen (TYLENOL) suppository 650 mg, 650 mg, Rectal, Q6H PRN, Jani Gravel, MD .  ceFAZolin (ANCEF) IVPB 2g/100 mL premix, 2 g, Intravenous, On Call to OR, Carole Civil, MD .  Chlorhexidine Gluconate Cloth 2 % PADS 6 each, 6 each, Topical, Q0600, Emokpae, Courage, MD .  gabapentin (NEURONTIN) tablet 600 mg, 600 mg, Oral, TID, Jani Gravel, MD .  HYDROmorphone (DILAUDID) injection 1 mg, 1 mg, Intravenous, Q2H, Carole Civil, MD .  methadone (DOLOPHINE) 10 MG/ML solution 60 mg, 60 mg, Oral, q morning - 10a, Jani Gravel, MD .  mupirocin ointment (BACTROBAN) 2 % 1 application, 1 application, Nasal, BID, Jani Gravel, MD, 1 application at [id number] .  mupirocin ointment (BACTROBAN) 2 % 1 application, 1 application, Nasal, BID, Emokpae, Courage, MD .  ondansetron (ZOFRAN) injection 4 mg, 4 mg, Intravenous, Q6H PRN, Jani Gravel, MD .  predniSONE (DELTASONE)  tablet 20 mg, 20 mg, Oral, Q breakfast, Jani Gravel, MD .  pyridostigmine (MESTINON) tablet 30 mg, 30 mg, Oral, TID, Jani Gravel, MD    Physical Exam(=30) BP 122/82 (BP Location: Right Arm)   Pulse 74   Temp 98.9 F (37.2 C) (Oral)   Resp 16   Ht 5\' 3"  (1.6 m)   Wt 85.4 kg   SpO2 100%   BMI 33.35 kg/m   Gen. Appearance normal development, mesomorphic body habitus Peripheral vascular system normal color capillary refill pulses 2+ equal Lymph nodes negative right groin Gait unable to weight-bear or walk on the right leg  Left Upper extremity  Inspection revealed no malalignment or  asymmetry  Assessment of range of motion: Full range of motion was recorded  Assessment of stability: Elbow wrist and hand and shoulder were stable  Assessment of muscle strength and tone revealed grade 5 muscle strength and normal muscle tone  Skin was normal without rash lesion or ulceration  Right upper extremity  Inspection revealed no malalignment or asymmetry  Assessment of range of motion: Full range of motion was recorded  Assessment of stability: Elbow wrist and hand and shoulder were stable  Assessment of muscle strength and tone revealed grade 5 muscle strength and normal muscle tone  Skin was normal without rash lesion or ulceration  Right Lower extremity  Inspection mild external rotation of the right lower extremity  Ankle motion is normal knee and hip motion deferred because of pain and fracture  Knee is stable ankle is stable  Muscle tone is normal no tremor normal dorsiflexion plantarflexion strength  Skin normal no rash or lesion ulceration or erythema   LEFT  lower extremity Inspection revealed no malalignment or asymmetry  Assessment of range of motion: Full range of motion was recorded  Assessment of stability: Ankle, knee and hip were stable  Assessment of muscle strength and tone revealed grade 5 muscle strength and normal muscle tone  Skin was normal without rash lesion or ulceration  Coordination was tested by finger-to-nose nose and was normal Deep tendon reflexes were 2+ in the upper extremities Examination of sensation by touch was normal  Mental status  Oriented to time person and place normal  Mood and affect normal without depression anxiety or agitation  Dx: Right hip fracture, fractures at the base of the neck of the right femur intratrochanteric fracture equivalent  Data Reviewed  ER RECORD REVIEWED: CONFIRMS HISTORY   I reviewed the following images and the reports and my independent interpretation is right hip fracture at the base of the  neck, essentially an intertrochanteric fracture   Assessment  RIGHT HIP FRACTURE NON DISPLACED  Plan   ORIF RIGHT HIP WITH COMPRESSION SCREW AND SIDE PLATE   The procedure has been fully reviewed with the patient; The risks and benefits of surgery have been discussed and explained and understood. Alternative treatment has also been reviewed, questions were encouraged and answered. The postoperative plan is also been reviewed.    Carole Civil MD

## 2018-12-27 NOTE — Anesthesia Procedure Notes (Signed)
Procedure Name: Intubation Date/Time: 12/27/2018 9:27 AM Performed by: Andree Elk, Madisyn Mawhinney A, CRNA Pre-anesthesia Checklist: Patient identified, Patient being monitored, Timeout performed, Emergency Drugs available and Suction available Patient Re-evaluated:Patient Re-evaluated prior to induction Oxygen Delivery Method: Circle system utilized Preoxygenation: Pre-oxygenation with 100% oxygen Induction Type: IV induction Ventilation: Mask ventilation without difficulty Laryngoscope Size: 3 and Glidescope Grade View: Grade I Tube type: Oral Tube size: 7.0 mm Number of attempts: 1 Airway Equipment and Method: Stylet and Video-laryngoscopy Placement Confirmation: ETT inserted through vocal cords under direct vision,  positive ETCO2 and breath sounds checked- equal and bilateral Secured at: 21 cm Tube secured with: Tape Dental Injury: Teeth and Oropharynx as per pre-operative assessment

## 2018-12-27 NOTE — Interval H&P Note (Signed)
History and Physical Interval Note:  12/27/2018 8:41 AM  BP 127/81   Pulse 72   Temp 98.8 F (37.1 C) (Oral)   Resp 17   Ht 5\' 3"  (1.6 m)   Wt 85.4 kg   SpO2 98%   BMI 33.35 kg/m   CBC Latest Ref Rng & Units 12/27/2018 12/26/2018 12/02/2011  WBC 4.0 - 10.5 K/uL 19.8(H) 18.1(H) 11.4(H)  Hemoglobin 12.0 - 15.0 g/dL 12.0 11.9(L) 13.2  Hematocrit 36.0 - 46.0 % 38.7 37.8 38.3  Platelets 150 - 400 K/uL 489(H) 469(H) 58    Mallory Mitchell  has presented today for surgery, with the diagnosis of right hip fracture.  The various methods of treatment have been discussed with the patient and family. After consideration of risks, benefits and other options for treatment, the patient has consented to  Procedure(s): COMPRESSION HIP (Right) as a surgical intervention.  The patient's history has been reviewed, patient examined, no change in status, stable for surgery.  I have reviewed the patient's chart and labs.  Questions were answered to the patient's satisfaction.     Arther Abbott

## 2018-12-27 NOTE — Progress Notes (Signed)
The patient is admitted to room 25 with the diagnosis of femoral neck fracture. A & O x 4. Patient oriented to ascom/call bell and staff. Full assessment to epic completed. Will prep for surgery today.

## 2018-12-28 ENCOUNTER — Encounter (HOSPITAL_COMMUNITY): Payer: Self-pay | Admitting: Orthopedic Surgery

## 2018-12-28 LAB — CBC
HCT: 30.9 % — ABNORMAL LOW (ref 36.0–46.0)
Hemoglobin: 9.6 g/dL — ABNORMAL LOW (ref 12.0–15.0)
MCH: 27.2 pg (ref 26.0–34.0)
MCHC: 31.1 g/dL (ref 30.0–36.0)
MCV: 87.5 fL (ref 80.0–100.0)
Platelets: 430 10*3/uL — ABNORMAL HIGH (ref 150–400)
RBC: 3.53 MIL/uL — ABNORMAL LOW (ref 3.87–5.11)
RDW: 14.6 % (ref 11.5–15.5)
WBC: 27.9 10*3/uL — ABNORMAL HIGH (ref 4.0–10.5)
nRBC: 0 % (ref 0.0–0.2)

## 2018-12-28 LAB — URINALYSIS, ROUTINE W REFLEX MICROSCOPIC
Bilirubin Urine: NEGATIVE
Glucose, UA: NEGATIVE mg/dL
Ketones, ur: NEGATIVE mg/dL
Nitrite: NEGATIVE
Protein, ur: NEGATIVE mg/dL
Specific Gravity, Urine: 1.006 (ref 1.005–1.030)
pH: 7 (ref 5.0–8.0)

## 2018-12-28 MED ORDER — ONDANSETRON HCL 4 MG PO TABS: 4.0000 mg | ORAL_TABLET | Freq: Four times a day (QID) | ORAL | 0 refills | Status: DC | PRN

## 2018-12-28 MED ORDER — CELECOXIB 200 MG PO CAPS: 200.0000 mg | ORAL_CAPSULE | Freq: Two times a day (BID) | ORAL | 1 refills | Status: DC

## 2018-12-28 MED ORDER — METHADONE HCL 10 MG PO TABS
60.0000 mg | ORAL_TABLET | Freq: Every morning | ORAL | Status: DC
  Administered 2018-12-28: 09:00:00 60 mg via ORAL
  Filled 2018-12-28: qty 6

## 2018-12-28 MED ORDER — SENNOSIDES-DOCUSATE SODIUM 8.6-50 MG PO TABS: 2.0000 | ORAL_TABLET | Freq: Two times a day (BID) | ORAL | 2 refills | Status: DC

## 2018-12-28 MED ORDER — ACETAMINOPHEN 325 MG PO TABS: 650.0000 mg | ORAL_TABLET | Freq: Four times a day (QID) | ORAL | 1 refills | Status: AC | PRN

## 2018-12-28 MED ORDER — PANTOPRAZOLE SODIUM 40 MG PO TBEC: 40.0000 mg | DELAYED_RELEASE_TABLET | Freq: Every day | ORAL | 1 refills | Status: AC

## 2018-12-28 MED ORDER — ENOXAPARIN SODIUM 40 MG/0.4ML ~~LOC~~ SOLN: 40.0000 mg | SUBCUTANEOUS | 0 refills | Status: DC

## 2018-12-28 NOTE — Evaluation (Signed)
Physical Therapy Evaluation Patient Details Name: Mallory Mitchell MRN: [id number] DOB: 27-Oct-1985 Today's Date: 12/28/2018   History of Present Illness  Briannia Buckel  is a 33 y.o. female, s/p ORIF right hip fracture on 12/27/18, w myasthenia gravis (new diagnosis), seizure do , depression, on chronic prednisone for the past few months c/o fall , mechanical somewhere around 12/16/18 or 12/18/18 apparently presented to ER due to pain on 12/20/2018 w negative xray and then represented today due to continued pain after seeing Donalda Ewings yesterday    Clinical Impression  Patient demonstrates poor return for attempting sit to stands and gait using Axillary Crutches, safer using RW and demonstrates mostly step to pattern with limited heel to toe stepping on right foot due to increased hip pain, no loss of balance and tolerated sitting up in chair after therapy.  After resting patient demonstrated good return for practicing sit to stands from chair using RW without assistance.  Patient will benefit from continued physical therapy in hospital and recommended venue below to increase strength, balance, endurance for safe ADLs and gait.    Follow Up Recommendations Home health PT;Supervision for mobility/OOB;Supervision - Intermittent    Equipment Recommendations  Rolling walker with 5" wheels;3in1 (PT)    Recommendations for Other Services       Precautions / Restrictions Precautions Precautions: Fall Restrictions Weight Bearing Restrictions: Yes RLE Weight Bearing: Weight bearing as tolerated      Mobility  Bed Mobility Overal bed mobility: Needs Assistance Bed Mobility: Supine to Sit     Supine to sit: Min guard;Min assist     General bed mobility comments: slow labored movement, required assistance to move RLE  Transfers Overall transfer level: Needs assistance Equipment used: Rolling walker (2 wheeled) Transfers: Sit to/from [facility] Sit to Stand: Min  guard;Supervision Stand pivot transfers: Supervision;Min guard       General transfer comment: diffiuclty for sit to stands due to BLE weakness, increased right hip pain  Ambulation/Gait Ambulation/Gait assistance: Supervision;Min guard Gait Distance (Feet): 60 Feet Assistive device: Rolling walker (2 wheeled) Gait Pattern/deviations: Decreased step length - right;Decreased step length - left;Decreased stance time - right;Decreased stride length;Antalgic Gait velocity: decreased   General Gait Details: slightly labored slow cadence with fair/poor return for right heel to toe stepping due to increased pain, no loss of balance  Stairs            Wheelchair Mobility    Modified Rankin (Stroke Patients Only)       Balance Overall balance assessment: Needs assistance Sitting-balance support: Feet supported;No upper extremity supported Sitting balance-Leahy Scale: Good Sitting balance - Comments: seated at bedside   Standing balance support: During functional activity;Bilateral upper extremity supported Standing balance-Leahy Scale: Fair Standing balance comment: using RW, poor when attempting use of Axillary Crutches                             Pertinent Vitals/Pain Pain Assessment: Faces Faces Pain Scale: Hurts even more Pain Location: right hip with movement Pain Descriptors / Indicators: Sore;Sharp;Grimacing;Guarding Pain Intervention(s): Limited activity within patient's tolerance;Monitored during session;Premedicated before session    Home Living Family/patient expects to be discharged to:: Private residence Living Arrangements: Children;Spouse/significant other Available Help at Discharge: Family;Available PRN/intermittently Type of Home: House Home Access: Stairs to enter Entrance Stairs-Rails: None Entrance Stairs-Number of Steps: 2 Home Layout: One level Home Equipment: Crutches;Wheelchair - manual      Prior Function Level  of Independence:  Independent         Comments: Hydrographic surveyor, drives     Journalist, newspaper        Extremity/Trunk Assessment   Upper Extremity Assessment Upper Extremity Assessment: Overall WFL for tasks assessed    Lower Extremity Assessment Lower Extremity Assessment: Generalized weakness;RLE deficits/detail;LLE deficits/detail RLE Deficits / Details: grossly 3+/5 RLE: Unable to fully assess due to pain RLE Sensation: WNL RLE Coordination: WNL LLE Deficits / Details: grossly 4+/5 LLE Sensation: WNL LLE Coordination: WNL    Cervical / Trunk Assessment Cervical / Trunk Assessment: Normal  Communication   Communication: No difficulties  Cognition Arousal/Alertness: Awake/alert Behavior During Therapy: WFL for tasks assessed/performed Overall Cognitive Status: Within Functional Limits for tasks assessed                                        General Comments      Exercises Total Joint Exercises Ankle Circles/Pumps: Supine;5 reps;Strengthening;AROM;Right Quad Sets: Supine;5 reps;Strengthening;Right;AROM Gluteal Sets: Supine;5 reps;Both;Strengthening;AROM Short Arc Quad: Supine;Right;AROM;Strengthening;10 reps Heel Slides: AROM;Strengthening;Right;Supine;15 reps   Assessment/Plan    PT Assessment Patient needs continued PT services  PT Problem List Decreased strength;Decreased activity tolerance;Decreased balance;Decreased range of motion;Decreased mobility       PT Treatment Interventions Balance training;Gait training;Stair training;Functional mobility training;Therapeutic activities;Therapeutic exercise;Patient/family education    PT Goals (Current goals can be found in the Care Plan section)  Acute Rehab PT Goals Patient Stated Goal: return home with family to assist PT Goal Formulation: With patient Time For Goal Achievement: 12/31/18 Potential to Achieve Goals: Good    Frequency Min 4X/week   Barriers to discharge        Co-evaluation                AM-PAC PT "6 Clicks" Mobility  Outcome Measure Help needed turning from your back to your side while in a flat bed without using bedrails?: A Little Help needed moving from lying on your back to sitting on the side of a flat bed without using bedrails?: A Little Help needed moving to and from a bed to a chair (including a wheelchair)?: A Little Help needed standing up from a chair using your arms (e.g., wheelchair or bedside chair)?: A Little Help needed to walk in hospital room?: A Little Help needed climbing 3-5 steps with a railing? : A Lot 6 Click Score: 17    End of Session   Activity Tolerance: Patient tolerated treatment well;Patient limited by fatigue;Patient limited by pain Patient left: in chair;with call bell/phone within reach Nurse Communication: Mobility status PT Visit Diagnosis: Unsteadiness on feet (R26.81);Other abnormalities of gait and mobility (R26.89);Muscle weakness (generalized) (M62.81)    Time: [id number] PT Time Calculation (min) (ACUTE ONLY): 37 min   Charges:   PT Evaluation $PT Eval Moderate Complexity: 1 Mod PT Treatments $Gait Training: 8-22 mins $Therapeutic Exercise: 8-22 mins        12:22 PM, 12/28/18 Lonell Grandchild, MPT Physical Therapist with [facility] [phone] office [phone] mobile phone

## 2018-12-28 NOTE — Plan of Care (Signed)

## 2018-12-28 NOTE — Plan of Care (Signed)
  Problem: Acute Rehab PT Goals(only PT should resolve) Goal: Pt Will Go Supine/Side To Sit Outcome: Progressing Flowsheets (Taken 12/28/2018 1224) Pt will go Supine/Side to Sit: with min guard assist Goal: Patient Will Transfer Sit To/From Stand Outcome: Progressing Flowsheets (Taken 12/28/2018 1224) Patient will transfer sit to/from stand: with supervision Goal: Pt Will Transfer Bed To Chair/Chair To Bed Outcome: Progressing Flowsheets (Taken 12/28/2018 1224) Pt will Transfer Bed to Chair/Chair to Bed: with supervision Goal: Pt Will Ambulate Outcome: Progressing Flowsheets (Taken 12/28/2018 1224) Pt will Ambulate:  100 feet  with supervision  with rolling walker   12:24 PM, 12/28/18 Lonell Grandchild, MPT Physical Therapist with [facility] [phone] office [phone] mobile phone

## 2018-12-28 NOTE — Discharge Instructions (Signed)
1) weightbearing as tolerated--- physical therapist will come to your house to help you rehab your right hip 2) follow-up with primary care physician in a week for repeat CBC/complete blood count test 3) follow-up with orthopedic surgeon Dr. Arther Abbott in 2 weeks for wound check  4) repeat hip x-rays in about 6 weeks and again in about 12 weeks  5) take Lovenox/enoxaparin 40 mg injections daily for the next 35 days to prevent blood clots due to hip surgery  6) because you are taking Lovenox/enoxaparin which is your blood thinner please avoid nonsteroidal anti-inflammatory drugs as they can make you bleed-Avoid ibuprofen/Advil/Aleve/Motrin/Goody Powders/Naproxen/BC powders/Meloxicam/Diclofenac/Indomethacin and other Nonsteroidal anti-inflammatory medications as these will make you more likely to bleed and can cause stomach ulcers, can also cause Kidney problems.

## 2018-12-28 NOTE — Progress Notes (Signed)
CRITICAL VALUE ALERT  Critical Value:  Blood culture aerobic shows gram positive Rods.   Date & Time Notied:  12/28/18 2100  Provider Notified:  [place]  Patient is currently discharged. RN [facility] made aware also.   Orders Received/Actions taken:

## 2018-12-28 NOTE — TOC Transition Note (Signed)
Transition of Care [facility]) - CM/SW Discharge Note   Patient Details  Name: Mallory Mitchell MRN: [id number] Date of Birth: [birth year]  [facility]) CM/SW Contact:  Neoma Uhrich, Chauncey Reading, RN Phone Number: 12/28/2018, 12:03 PM   Clinical Narrative:   Postop day 1 status post ORIF right hip. Worked with PT today, did well, patient very happy she is able to walk independently. Recommended for HH PT, RW and 3 in 1. Patient agreeable. No preference of providers. Follows with Dr. Merlene Laughter and [facility].    Final next level of care: Home w Home Health Services Barriers to Discharge: No Barriers Identified   Patient Goals and CMS Choice   CMS Medicare.gov Compare Post Acute Care list provided to:: Patient Choice offered to / list presented to : Patient     Discharge Plan and Services   Discharge Planning Services: CM Consult Post Acute Care Choice: Home Health          DME Arranged: Bedside commode, Walker rolling DME Agency: [employer] Date DME Agency Contacted: 12/28/18 Time DME Agency Contacted: 1200 Representative spoke with at [place]: [place]: PT [place]: [place] ([place]) Date [place]: 12/28/18 Time [place]: 1200 Representative spoke with at [place]: [place] ([place]) Interventions     Readmission Risk Interventions No flowsheet data found.

## 2018-12-28 NOTE — Discharge Summary (Addendum)
Mallory Mitchell, is a 33 y.o. female  DOB 1985-07-02  MRN [id number].  Admission date:  12/26/2018  Admitting Physician  Jani Gravel, MD  Discharge Date:  12/28/2018   Primary MD  Health, [facility]  Recommendations for primary care physician for things to follow:   1) weightbearing as tolerated--- physical therapist will come to your house to help you rehab your right hip 2) follow-up with primary care physician in a week for repeat CBC/complete blood count test 3) follow-up with orthopedic surgeon Dr. Arther Abbott in 2 weeks for wound check  4) repeat hip x-rays in about 6 weeks and again in about 12 weeks  5) take Lovenox/enoxaparin 40 mg injections daily for the next 35 days to prevent blood clots due to hip surgery  6) because you are taking Lovenox/enoxaparin which is your blood thinner please avoid nonsteroidal anti-inflammatory drugs as they can make you bleed-Avoid ibuprofen/Advil/Aleve/Motrin/Goody Powders/Naproxen/BC powders/Meloxicam/Diclofenac/Indomethacin and other Nonsteroidal anti-inflammatory medications as these will make you more likely to bleed and can cause stomach ulcers, can also cause Kidney problems.  Admission Diagnosis  Back pain [M54.9] Preop cardiovascular exam [Z01.810] Closed fracture of right femur, unspecified fracture morphology, unspecified portion of femur, initial encounter ([place]) [S72.91XA]  Discharge Diagnosis  Back pain [M54.9] Preop cardiovascular exam [Z01.810] Closed fracture of right femur, unspecified fracture morphology, unspecified portion of femur, initial encounter ([place]) [S72.91XA]   Principal Problem:   Femoral neck fracture ([place])     Past Medical History:  Diagnosis Date  . Depression   . Myasthenia gravis ([place])   . Seizures ([place])     Past Surgical History:  Procedure Laterality Date  . COMPRESSION HIP SCREW Right 12/27/2018   Procedure:  OPEN REDUCTION INTERNAL FIXATION RIGHT HIP;  Surgeon: Carole Civil, MD;  Location: [facility];  Service: Orthopedics;  Laterality: Right;  . MOUTH SURGERY         HPI  from the history and physical done on the day of admission:    - Mallory Mitchell  is a 33 y.o. female, w myasthenia gravis (new diagnosis), seizure do , depression, on chronic prednisone for the past few months c/o fall , mechanical somewhere around 12/16/18 or 12/18/18 apparently presented to ER due to pain on 12/20/2018 w negative xray and then represented today due to continued pain after seeing Kofi [place] yesterday.    In ED,  T 99, P 85  R 16, Bp 126/82  Pox 99% on RA Wt 81.6kg   Wbc 18.1, Hgb 11.9, Plt 469 Na 135, K 4.1,  Bun 16, Creatinine 0.51  CT pelvis IMPRESSION: Undisplaced fracture at the base of the right femoral neck without significant extension into the intratrochanteric region.  No other focal abnormality is noted  Ed spoke with Dr. Aline Brochure  Pt will be admitted for right femoral neck fracture.      Hospital Course:      Brief Summary 33 y.o.female,w myasthenia gravis (new diagnosis), seizure do , opiate dependence on methadone , depression, on chronic prednisone for the  past few months c/o fall , mechanical somewhere around 12/16/18 or 12/18/18 apparently presented to ER due to pain on 12/20/2018 w negative xray , CT of the right hip: 12/26/2018 with right femoral fracture, status post ORIF on 12/27/2018  A/p 1)Rt Hip Fx- status post ORIF on 12/27/2018 [facility]AT- -outpatient Ortho follow-up in 2 weeks -Lovenox for DVT prophylaxis for 35 days -Repeat hip x-rays and CT scan then in 12 weeks  2) opioid dependence--- continue methadone 60 mg daily as outpatient at the methadone clinic, continue gabapentin  3)MG--stable, continue prednisone 20 mg daily and pyridostigmine 30 mg tid  4) acute blood loss anemia--due to right hip surgery, repeat CBC in 1 week advised especially while on  Lovenox for DVT prophylaxis -Hemoglobin is down to 9.6 from 11.9 in the postop patient  -Avoid NSAIDs to reduce risk of GI bleed while on Lovenox given anemia, may use Protonix for GI prophylaxis  5) leukocytosis--- WBC is up to 27.9 from 19.8, no fevers, no productive cough, no URI or UTI symptoms -No abdominal pain -Blood cultures obtained urine cultures obtained -UA not suggestive of UTI -Patient does not look toxic ??  If creatinine leukocytosis -Patient will call back if fevers or further concerns otherwise repeat CBC in 1 week as advised.... Cultures obtained as above results pending   Code Status : Full  Family Communication:   NA (patient is alert, awake and coherent)  Disposition Plan  : Home with home health PT  Consults  :  ortho  DVT Prophylaxis  :  Lovenox -    Discharge Condition: Medically stable, no dizziness no orthostatic symptoms no dyspnea on exertion  Follow UP--Dr. Aline Brochure orthopedic surgeon in 2 weeks  Diet and Activity recommendation:  As advised  Discharge Instructions    Discharge Instructions    Call MD for:  difficulty breathing, headache or visual disturbances   Complete by: As directed    Call MD for:  persistant dizziness or light-headedness   Complete by: As directed    Call MD for:  persistant nausea and vomiting   Complete by: As directed    Call MD for:  severe uncontrolled pain   Complete by: As directed    Call MD for:  temperature >100.4   Complete by: As directed    Diet general   Complete by: As directed    Discharge instructions   Complete by: As directed    1) weightbearing as tolerated--- physical therapist will come to your house to help you rehab your right hip 2) follow-up with primary care physician in a week for repeat CBC/complete blood count test 3) follow-up with orthopedic surgeon Dr. Arther Abbott in 2 weeks for wound check  4) repeat hip x-rays in about 6 weeks and again in about 12 weeks  5) take  Lovenox/enoxaparin 40 mg injections daily for the next 35 days to prevent blood clots due to hip surgery  6) because you are taking Lovenox/enoxaparin which is your blood thinner please avoid nonsteroidal anti-inflammatory drugs as they can make you bleed-Avoid ibuprofen/Advil/Aleve/Motrin/Goody Powders/Naproxen/BC powders/Meloxicam/Diclofenac/Indomethacin and other Nonsteroidal anti-inflammatory medications as these will make you more likely to bleed and can cause stomach ulcers, can also cause Kidney problems.   Increase activity slowly   Complete by: As directed    weightbearing as tolerated The wound can be checked at 2 weeks       Discharge Medications     Allergies as of 12/28/2018   No Known Allergies  Medication List    STOP taking these medications   BC Fast Pain Relief 650-195-33.3 MG Pack Generic drug: Aspirin-Salicylamide-Caffeine   cyclobenzaprine 10 MG tablet Commonly known as: FLEXERIL     TAKE these medications   acetaminophen 325 MG tablet Commonly known as: TYLENOL Take 2 tablets (650 mg total) by mouth every 6 (six) hours as needed for mild pain, fever or headache (or Fever >/= 101). What changed:   medication strength  how much to take  reasons to take this   celecoxib 200 MG capsule Commonly known as: CELEBREX Take 1 capsule (200 mg total) by mouth 2 (two) times daily with a meal.   enoxaparin 40 MG/0.4ML injection Commonly known as: LOVENOX Inject 0.4 mLs (40 mg total) into the skin daily. For 35 days   gabapentin 600 MG tablet Commonly known as: NEURONTIN Take 600 mg by mouth 3 (three) times daily.   methadone 10 MG/ML solution Commonly known as: DOLOPHINE Take 60 mg by mouth every morning.   ondansetron 4 MG tablet Commonly known as: ZOFRAN Take 1 tablet (4 mg total) by mouth every 6 (six) hours as needed for nausea.   pantoprazole 40 MG tablet Commonly known as: Protonix Take 1 tablet (40 mg total) by mouth daily.   predniSONE  10 MG tablet Commonly known as: DELTASONE Take 20 mg by mouth daily with breakfast.   pyridostigmine 60 MG tablet Commonly known as: MESTINON Take 30 mg by mouth 3 (three) times daily.   senna-docusate 8.6-50 MG tablet Commonly known as: Senokot-S Take 2 tablets by mouth 2 (two) times daily.            Durable Medical Equipment  (From admission, onward)         Start     Ordered   12/28/18 1146  For home use only DME Bedside commode  Once    Question:  Patient needs a bedside commode to treat with the following condition  Answer:  S/P ORIF (open reduction internal fixation) fracture   12/28/18 1145   12/28/18 1146  For home use only DME Walker rolling  Once    Question:  Patient needs a walker to treat with the following condition  Answer:  S/P ORIF (open reduction internal fixation) fracture   12/28/18 1145         Major procedures and Radiology Reports - PLEASE review detailed and final reports for all details, in brief -   Dg Lumbar Spine Complete  Result Date: 12/20/2018 CLINICAL DATA:  Low back pain with right-sided sciatica. Recent fall EXAM: LUMBAR SPINE - COMPLETE 4+ VIEW COMPARISON:  None. FINDINGS: Frontal, lateral, spot lumbosacral lateral, and bilateral oblique views were obtained. There are 5 non-rib-bearing lumbar type vertebral bodies. There is no fracture or spondylolisthesis. There is moderate disc space narrowing at L3-4, L4-5, and L5-S1. There is no appreciable facet arthropathy. IMPRESSION: Disc space narrowing at L3-4, L4-5, and L5-S1. No fracture or spondylolisthesis. Electronically Signed   By: Lowella Grip III M.D.   On: 12/20/2018 15:59   Ct Lumbar Spine Wo Contrast  Result Date: 12/26/2018 CLINICAL DATA:  33 year old female with low back pain radiating down right hip. EXAM: CT LUMBAR SPINE WITHOUT CONTRAST TECHNIQUE: Multidetector CT imaging of the lumbar spine was performed without intravenous contrast administration. Multiplanar CT image  reconstructions were also generated. COMPARISON:  Lumbar spine radiograph dated 12/20/2018 FINDINGS: Segmentation: 5 lumbar type vertebrae. Alignment: Normal. Vertebrae: No acute fracture or focal pathologic process. Paraspinal and other soft  tissues: Negative. Disc levels: Unremarkable. No degenerative changes. The neural foramina are patent. Probable old healed left posterior twelfth rib fracture. IMPRESSION: No acute/traumatic lumbar spine pathology. No degenerative changes or neural foramina stenosis. Electronically Signed   By: Anner Crete M.D.   On: 12/26/2018 19:00   Ct Pelvis Wo Contrast  Result Date: 12/26/2018 CLINICAL DATA:  Right-sided hip pain with negative films, subsequent encounter EXAM: CT PELVIS WITHOUT CONTRAST TECHNIQUE: Multidetector CT imaging of the pelvis was performed following the standard protocol without intravenous contrast. COMPARISON:  12/20/2018 FINDINGS: Urinary Tract: Bladder is partially distended. Visualized kidneys appear within normal limits. Bowel: Appendix is unremarkable. No specific bowel abnormality is noted. Vascular/Lymphatic: No pathologically enlarged lymph nodes. No significant vascular abnormality seen. Reproductive: Uterus appears within normal limits. No ovarian lesions are seen. Other:  No free fluid is noted. Musculoskeletal: There is an undisplaced fracture at the base of the right femoral neck without significant extension in the intratrochanteric region. Acetabula are within normal limits. No other fracture is seen. IMPRESSION: Undisplaced fracture at the base of the right femoral neck without significant extension into the intratrochanteric region. No other focal abnormality is noted. Electronically Signed   By: Inez Catalina M.D.   On: 12/26/2018 18:55   Dg Chest Port 1 View  Result Date: 12/26/2018 CLINICAL DATA:  Preop right hip surgery. EXAM: PORTABLE CHEST 1 VIEW COMPARISON:  Radiograph 11/29/2011 FINDINGS: The cardiomediastinal contours are  normal. The lungs are clear. Pulmonary vasculature is normal. No consolidation, pleural effusion, or pneumothorax. No acute osseous abnormalities are seen. IMPRESSION: Negative AP view of the chest. Electronically Signed   By: Keith Rake M.D.   On: 12/26/2018 22:20   Dg Hip Operative Unilat With Pelvis Right  Result Date: 12/27/2018 CLINICAL DATA:  ORIF right hip EXAM: OPERATIVE right HIP (WITH PELVIS IF PERFORMED) AP and lateral VIEWS TECHNIQUE: Fluoroscopic spot image(s) were submitted for interpretation post-operatively. COMPARISON:  Preoperative assessment 12/26/2018 FINDINGS: Seven total images are submitted, initial AP and lateral images of the right hip. Subsequently images interval ORIF with dynamic hip screw. No hardware complication is noted on limited images. Alignment preserved. Right hip located. IMPRESSION: Post ORIF right hip no immediate complicating features. Electronically Signed   By: Zetta Bills M.D.   On: 12/27/2018 11:30   Dg Hip Unilat W Or Wo Pelvis 2-3 Views Right  Result Date: 12/26/2018 CLINICAL DATA:  33 year old female with fall and right hip pain. EXAM: DG HIP (WITH OR WITHOUT PELVIS) 2-3V RIGHT COMPARISON:  Right hip radiograph dated 12/20/2018 FINDINGS: Nondisplaced basicervical fracture of the right femoral neck. There is no dislocation. The bones are somewhat osteopenic for age. The soft tissues are unremarkable. IMPRESSION: Nondisplaced fracture of the right femoral neck. No dislocation. Electronically Signed   By: Anner Crete M.D.   On: 12/26/2018 21:23   Dg Hip Unilat W Or Wo Pelvis 2-3 Views Right  Result Date: 12/20/2018 CLINICAL DATA:  Low back pain, chronic worsened after 2 days. EXAM: DG HIP (WITH OR WITHOUT PELVIS) 2-3V RIGHT COMPARISON:  Lumbar spine of the same date. FINDINGS: AP view of the pelvis and two views of the right hip show no signs of fracture or dislocation. Soft tissues are normal. IMPRESSION: Negative. Electronically Signed   By:  Zetta Bills M.D.   On: 12/20/2018 15:59    Micro Results   Recent Results (from the past 240 hour(s))  SARS Coronavirus 2 [facility] order, Performed in [facility] hospital lab) Nasopharyngeal Nasopharyngeal Swab  Status: None   Collection Time: 12/26/18  7:39 PM   Specimen: Nasopharyngeal Swab  Result Value Ref Range Status   SARS Coronavirus 2 NEGATIVE NEGATIVE Final    Comment: (NOTE) If result is NEGATIVE SARS-CoV-2 target nucleic acids are NOT DETECTED. The SARS-CoV-2 RNA is generally detectable in upper and lower  respiratory specimens during the acute phase of infection. The lowest  concentration of SARS-CoV-2 viral copies this assay can detect is 250  copies / mL. A negative result does not preclude SARS-CoV-2 infection  and should not be used as the sole basis for treatment or other  patient management decisions.  A negative result may occur with  improper specimen collection / handling, submission of specimen other  than nasopharyngeal swab, presence of viral mutation(s) within the  areas targeted by this assay, and inadequate number of viral copies  (<250 copies / mL). A negative result must be combined with clinical  observations, patient history, and epidemiological information. If result is POSITIVE SARS-CoV-2 target nucleic acids are DETECTED. The SARS-CoV-2 RNA is generally detectable in upper and lower  respiratory specimens dur ing the acute phase of infection.  Positive  results are indicative of active infection with SARS-CoV-2.  Clinical  correlation with patient history and other diagnostic information is  necessary to determine patient infection status.  Positive results do  not rule out bacterial infection or co-infection with other viruses. If result is PRESUMPTIVE POSTIVE SARS-CoV-2 nucleic acids MAY BE PRESENT.   A presumptive positive result was obtained on the submitted specimen  and confirmed on repeat testing.  While 2019 novel coronavirus   (SARS-CoV-2) nucleic acids may be present in the submitted sample  additional confirmatory testing may be necessary for epidemiological  and / or clinical management purposes  to differentiate between  SARS-CoV-2 and other Sarbecovirus currently known to infect humans.  If clinically indicated additional testing with an alternate test  methodology [id number]) is advised. The SARS-CoV-2 RNA is generally  detectable in upper and lower respiratory sp ecimens during the acute  phase of infection. The expected result is Negative. Fact Sheet for Patients:  StrictlyIdeas.no Fact Sheet for Healthcare Providers: BankingDealers.co.za This test is not yet approved or cleared by the [place] FDA and has been authorized for detection and/or diagnosis of SARS-CoV-2 by FDA under an Emergency Use Authorization (EUA).  This EUA will remain in effect (meaning this test can be used) for the duration of the COVID-19 declaration under Section 564(b)(1) of the Act, 21 U.S.C. section 360bbb-3(b)(1), unless the authorization is terminated or revoked sooner. Performed at [facility], [address], [place], [place] [address]   Surgical PCR screen     Status: Abnormal   Collection Time: 12/27/18 12:02 AM   Specimen: Nasal Mucosa; Nasal Swab  Result Value Ref Range Status   MRSA, PCR POSITIVE (A) NEGATIVE Final    Comment: RESULT CALLED TO, READ BACK BY AND VERIFIED WITH: B. JOHNSON,RN @0654   10/01/2020KAY    Staphylococcus aureus POSITIVE (A) NEGATIVE Final    Comment: (NOTE) The Xpert SA Assay (FDA approved for NASAL specimens in patients 57 years of age and older), is one component of a comprehensive surveillance program. It is not intended to diagnose infection nor to guide or monitor treatment. Performed at [facility], [address] [place], [place] [address]   Culture, blood (Routine X 2) w Reflex to ID Panel     Status: None (Preliminary  result)   Collection Time: 12/28/18  9:23 AM  Specimen: Right Antecubital; Blood  Result Value Ref Range Status   Specimen Description   Final    RIGHT ANTECUBITAL BOTTLES DRAWN AEROBIC AND ANAEROBIC   Special Requests   Final    Blood Culture adequate volume Performed at [facility], [address], [place], [place] [address]    Culture PENDING  Incomplete   Report Status PENDING  Incomplete  Culture, blood (Routine X 2) w Reflex to ID Panel     Status: None (Preliminary result)   Collection Time: 12/28/18  9:23 AM   Specimen: BLOOD RIGHT HAND  Result Value Ref Range Status   Specimen Description BLOOD RIGHT HAND BOTTLES DRAWN AEROBIC ONLY  Final   Special Requests   Final    Blood Culture results may not be optimal due to an inadequate volume of blood received in culture bottles Performed at [facility], [address], [place], [place] [address]    Culture PENDING  Incomplete   Report Status PENDING  Incomplete    Today   Subjective    Lattie Spannagel today has no dizziness no orthostatic symptoms no dyspnea on exertion          Patient has been seen and examined prior to discharge   Objective   Blood pressure 122/76, pulse (!) 104, temperature 98.7 F (37.1 C), temperature source Oral, resp. rate 18, height 5\' 3"  (1.6 m), weight 91.2 kg, SpO2 98 %.   Intake/Output Summary (Last 24 hours) at 12/28/2018 1644 Last data filed at 12/28/2018 0700 Gross per 24 hour  Intake 1100.86 ml  Output 1650 ml  Net -549.14 ml    Exam Gen:- Awake Alert, no acute distress  HEENT:- [place].AT, No sclera icterus Neck-Supple Neck,No JVD,.  Lungs-  CTAB , good air movement bilaterally  CV- S1, S2 normal, regular Abd-  +ve B.Sounds, Abd Soft, No tenderness,    Extremity/Skin:- No  edema,   good pulses Psych-affect is appropriate, oriented x3 Neuro-no new focal deficits, no tremors  -MSK--right hip postop wound clean dry intact   Data Review   CBC w Diff:  Lab Results  Component  Value Date   WBC 27.9 (H) 12/28/2018   HGB 9.6 (L) 12/28/2018   HCT 30.9 (L) 12/28/2018   PLT 430 (H) 12/28/2018   LYMPHOPCT 23 12/26/2018   MONOPCT 6 12/26/2018   EOSPCT 0 12/26/2018   BASOPCT 0 12/26/2018    CMP:  Lab Results  Component Value Date   NA 137 12/27/2018   K 4.2 12/27/2018   CL 100 12/27/2018   CO2 27 12/27/2018   BUN 19 12/27/2018   CREATININE 0.55 12/27/2018   PROT 6.3 (L) 12/27/2018   ALBUMIN 3.3 (L) 12/27/2018   BILITOT 0.5 12/27/2018   ALKPHOS 55 12/27/2018   AST 12 (L) 12/27/2018   ALT 16 12/27/2018  .   Total Discharge time is about 33 minutes  Roxan Hockey M.D on 12/28/2018 at 4:44 PM  Go to [email or web address] -  for contact info  [facility] - Office  [phone]

## 2018-12-28 NOTE — Progress Notes (Signed)
Patient ID: Mallory Mitchell, female   DOB: [birth year], 33 y.o.   MRN: [id number] BP 107/69 (BP Location: Right Arm)   Pulse 79   Temp 98.5 F (36.9 C)   Resp 17   Ht 5\' 3"  (1.6 m)   Wt 91.2 kg   SpO2 100%   BMI 35.62 kg/m   Postop day 1 status post ORIF right hip with dynamic hip screw for basicervical intertrochanteric equivalent fracture right hip  Seems less anxious today  CBC Latest Ref Rng & Units 12/28/2018 12/27/2018 12/26/2018  WBC 4.0 - 10.5 K/uL 27.9(H) 19.8(H) 18.1(H)  Hemoglobin 12.0 - 15.0 g/dL 9.6(L) 12.0 11.9(L)  Hematocrit 36.0 - 46.0 % 30.9(L) 38.7 37.8  Platelets 150 - 400 K/uL 430(H) 489(H) 469(H)    Recommend physical therapy weight-bear as tolerated  Methadone should be given early in the morning 60 mg daily baseline dose prior to admission  Patient is getting oxycodone 15 mg on a as needed basis and can go home with that    Postoperative plan She is weightbearing as tolerated Resume methadone as soon as possible 60 mg daily The wound can be checked at 2 weeks The hip can be x-rayed at 6 and 12 weeks Follow-up in 2 weeks DVT prevention for 35 days

## 2018-12-28 NOTE — Progress Notes (Signed)
Lab called a critical for patient. Patient is discharged. Critical lab: Blood culture aerobic shows gram positive Rods. Mid level on call notified.

## 2018-12-29 LAB — CULTURE, BLOOD (ROUTINE X 2): Special Requests: ADEQUATE

## 2018-12-29 LAB — BLOOD CULTURE ID PANEL (REFLEXED)

## 2018-12-29 LAB — URINE CULTURE
Culture: 10000 — AB
Special Requests: NORMAL

## 2018-12-31 ENCOUNTER — Other Ambulatory Visit: Payer: Self-pay | Admitting: Orthopedic Surgery

## 2018-12-31 ENCOUNTER — Telehealth: Payer: Self-pay

## 2018-12-31 ENCOUNTER — Telehealth: Payer: Self-pay | Admitting: Orthopedic Surgery

## 2018-12-31 DIAGNOSIS — S72001D Fracture of unspecified part of neck of right femur, subsequent encounter for closed fracture with routine healing: Secondary | ICD-10-CM

## 2018-12-31 MED ORDER — OXYCODONE-ACETAMINOPHEN 10-325 MG PO TABS: 1.0000 | ORAL_TABLET | Freq: Four times a day (QID) | ORAL | 0 refills | Status: DC | PRN

## 2018-12-31 NOTE — Progress Notes (Signed)
Patient has a history of opioid abuse dependence currently on methadone 60 mg a day baseline  Was getting good pain relief in the hospital with oxycodone but was sent home without any medication for pain

## 2018-12-31 NOTE — Telephone Encounter (Signed)
[name] Dabbs-PT wants to get verbal approval for this patient's therapy 2 x a week for 2 weeks.  Please call [name] at [phone]  Thanks

## 2018-12-31 NOTE — Telephone Encounter (Signed)
There was a message left on voicemail on 10/2 from Terri(patient's caretaker) stating that patient was in excruciating pain and was not given any pain medication at the hospital.   Please call patient at [phone]   [phone]

## 2018-12-31 NOTE — Telephone Encounter (Signed)
Left message to give verbal orders

## 2019-01-02 ENCOUNTER — Telehealth: Payer: Self-pay

## 2019-01-02 LAB — CULTURE, BLOOD (ROUTINE X 2): Culture: NO GROWTH

## 2019-01-02 NOTE — Telephone Encounter (Signed)
Patient called stating that her right foot/Leg is black/ blue and swollen. Stated she had hip surgery on 12/27/18 and is having a lot of pain. I offered for her to come in today, but she has no transportation. I suggested that she may need to go to ER. I did ask if necessary would she be able to come in tomorrow or Friday if needed and she said yes for Friday.  Please call and advise

## 2019-01-02 NOTE — Telephone Encounter (Signed)
I called her back, she is not resting, she is cleaning house and taking care of children. I have told her to rest and ice, and we will see her on Friday, she asked what time, and I do not see the appointment, I told her I would have you call her  Discussed with Dr Aline Brochure

## 2019-01-03 ENCOUNTER — Telehealth: Payer: Self-pay | Admitting: Radiology

## 2019-01-03 NOTE — Telephone Encounter (Signed)
Patient called, she stood up today and noticed she has bloody drainage on the bandage on her hip. I have told her to change the bandage, and I will call her back in a few minutes to see if the bleeding has stopped.  She also states her non operative hip is very painful and she thinks she has fractured it. I have advised her if she feels it is fractured she has to go to the Emergency room.

## 2019-01-03 NOTE — Telephone Encounter (Signed)
I spoke to patient and she now has advised very little blood was on the bandage. I have advised her a little bloody drainage is normal, but if it soaks through bandages, that is worrisome, let us know. She has voiced understanding and will let us know.   She is very anxious, reassurance provided.

## 2019-01-04 ENCOUNTER — Ambulatory Visit: Payer: Medicaid Other

## 2019-01-04 ENCOUNTER — Other Ambulatory Visit: Payer: Self-pay

## 2019-01-04 ENCOUNTER — Encounter: Payer: Self-pay | Admitting: Orthopedic Surgery

## 2019-01-04 ENCOUNTER — Ambulatory Visit (INDEPENDENT_AMBULATORY_CARE_PROVIDER_SITE_OTHER): Payer: Medicaid Other | Admitting: Orthopedic Surgery

## 2019-01-04 VITALS — BP 105/72 | HR 98 | Temp 97.2°F | Ht 63.0 in

## 2019-01-04 DIAGNOSIS — M25552 Pain in left hip: Secondary | ICD-10-CM

## 2019-01-04 DIAGNOSIS — M545 Low back pain, unspecified: Secondary | ICD-10-CM

## 2019-01-04 DIAGNOSIS — Z9889 Other specified postprocedural states: Secondary | ICD-10-CM

## 2019-01-04 DIAGNOSIS — S72001D Fracture of unspecified part of neck of right femur, subsequent encounter for closed fracture with routine healing: Secondary | ICD-10-CM

## 2019-01-04 MED ORDER — PREDNISONE 10 MG PO TABS: ORAL_TABLET | ORAL | 0 refills | Status: DC

## 2019-01-04 MED ORDER — OXYCODONE-ACETAMINOPHEN 10-325 MG PO TABS: 1.0000 | ORAL_TABLET | Freq: Four times a day (QID) | ORAL | 0 refills | Status: AC | PRN

## 2019-01-04 MED ORDER — METHOCARBAMOL 500 MG PO TABS: 500.0000 mg | ORAL_TABLET | Freq: Three times a day (TID) | ORAL | 1 refills | Status: DC

## 2019-01-04 NOTE — Progress Notes (Signed)
Chief Complaint  Patient presents with  . Routine Post Op    Recheck on right leg, DOS 12-27-18.  Marland Kitchen Hip Pain    left     Encounter Diagnoses  Name Primary?  . Pain in left hip Yes  . Lumbar pain   . Status post hip surgery October 1 dynamic hip screw right hip   . Closed fracture of right hip with routine healing, subsequent encounter     33 years old had ORIF right hip for inotrope type hip fracture at the base of the femoral neck on October 1 about.  After 2 days at home she developed pain left groin and left thigh presents for evaluation of her left hip and left thigh pain  Pain is severe.  Patient has history of opioid abuse is on methadone followed by [facility] in [place] [id number]  She is also on 20 mg of prednisone daily for myasthenia gravis she is on 10 mg every 6 of Percocet after her hip surgery not helping her left leg pain  Denies back pain denies bowel bladder dysfunction is having trouble weightbearing on the left leg  Physical Exam  BP 105/72   Pulse 98   Temp (!) 97.2 F (36.2 C)   Ht 5\' 3"  (1.6 m)   BMI 35.62 kg/m   Patient is very emotional nervous anxious  Tenderness left groin no tenderness left thigh walks on her tiptoes on the left hip full weightbearing on the right hip  Right hip wound looks normal no signs of DVT in either leg  She can flex her hip up on the left side and I can flex it it does not hurt when I do it  She has tenderness in her lower back left side  No numbness or tingling in the left leg  X-ray pelvis left hip no fracture dislocation right hip screw is in good position  Lumbar spine x-ray is notable for scoliotic list to the left degenerative facet arthritis L4-S1   I talked to [facility] they have agreed to increase her methadone and agreed that she can take oxycodone 10 every 6 I added muscle relaxer and increase her steroids  With a 1 week follow-up scheduled to recheck her left leg  Meds ordered this encounter   Medications  . methocarbamol (ROBAXIN) 500 MG tablet    Sig: Take 1 tablet (500 mg total) by mouth 3 (three) times daily.    Dispense:  60 tablet    Refill:  1  . predniSONE (DELTASONE) 10 MG tablet    Sig: 20 mg 3 times daily for 2 weeks    Dispense:  84 tablet    Refill:  0  . oxyCODONE-acetaminophen (PERCOCET) 10-325 MG tablet    Sig: Take 1 tablet by mouth every 6 (six) hours as needed for up to 7 days for pain.    Dispense:  28 tablet    Refill:  0

## 2019-01-04 NOTE — Patient Instructions (Signed)
You have new medicines to take for your left leg  [facility] has agreed to increase your methadone short-term for increased pain

## 2019-01-09 ENCOUNTER — Encounter (HOSPITAL_COMMUNITY): Payer: Self-pay

## 2019-01-09 ENCOUNTER — Ambulatory Visit (HOSPITAL_COMMUNITY): Payer: Medicaid Other

## 2019-01-09 DIAGNOSIS — S72001A Fracture of unspecified part of neck of right femur, initial encounter for closed fracture: Secondary | ICD-10-CM | POA: Insufficient documentation

## 2019-01-09 DIAGNOSIS — Z9889 Other specified postprocedural states: Secondary | ICD-10-CM | POA: Insufficient documentation

## 2019-01-10 ENCOUNTER — Telehealth: Payer: Self-pay | Admitting: Orthopedic Surgery

## 2019-01-10 NOTE — Telephone Encounter (Signed)
I called Mallory Mitchell her mail box is full  To Dr Aline Brochure she has drainage at incision, she has appointment already tomorrow.

## 2019-01-10 NOTE — Telephone Encounter (Signed)
Call from [facility], [place], [phone], requesting orders to extend therapy: 2 times per week for 3 weeks. Also relays that there is a small amount of drainage at surgical site - patient is aware of appointment with Dr Aline Brochure for tomorrow morning. States also that patient is favoring left hip, which is causing her quite a bit of pain.

## 2019-01-11 ENCOUNTER — Encounter: Payer: Self-pay | Admitting: Orthopedic Surgery

## 2019-01-11 ENCOUNTER — Ambulatory Visit (INDEPENDENT_AMBULATORY_CARE_PROVIDER_SITE_OTHER): Payer: Medicaid Other | Admitting: Orthopedic Surgery

## 2019-01-11 ENCOUNTER — Other Ambulatory Visit: Payer: Self-pay

## 2019-01-11 DIAGNOSIS — L24A9 Irritant contact dermatitis due friction or contact with other specified body fluids: Secondary | ICD-10-CM

## 2019-01-11 DIAGNOSIS — Z9889 Other specified postprocedural states: Secondary | ICD-10-CM

## 2019-01-11 DIAGNOSIS — T148XXA Other injury of unspecified body region, initial encounter: Secondary | ICD-10-CM

## 2019-01-11 DIAGNOSIS — S72001D Fracture of unspecified part of neck of right femur, subsequent encounter for closed fracture with routine healing: Secondary | ICD-10-CM

## 2019-01-11 MED ORDER — CEPHALEXIN 500 MG PO CAPS: 500.0000 mg | ORAL_CAPSULE | Freq: Two times a day (BID) | ORAL | 0 refills | Status: DC

## 2019-01-11 NOTE — Progress Notes (Signed)
Chief Complaint  Patient presents with  . Routine Post Op    12/27/2018 post op right hip / some drainage (slight bloody drainage noted on bandage)  . Hip Pain    left / has done better with therapy     33 year old female 33 year old female 2 weeks status post open treatment internal fixation right hip with compression hip screw complains of drainage at the end of the incision  She developed pain in her lower back on the left side including left thigh pain posterior thigh pain and left-sided lumbar discomfort treated with Robaxin and in the midst of a Sterapred increase to 20 mg 3 times a day  She is made some improvement in her left leg pain  Her right hip wound looks normal I could not express any drainage drainage appears to be coming from the inferior portion of the wound according to the patient  There is no tenderness to the wound or thigh no erythema  Probably related to not from the subcuticular stitch  Recommend cleaning it normally applying a dressing  Gave her a prescription of Keflex to get filled if it gets red  We will be seeing her next week  Encounter Diagnoses  Name Primary?  . Status post hip surgery December 27 2018 dynamic hip screw right hip   . Closed fracture of right hip with routine healing, subsequent encounter   . Wound drainage Yes

## 2019-01-11 NOTE — Patient Instructions (Signed)
Continue robaxin and prednisone   Right hip wound: if it gets red get antibiotic filled

## 2019-01-14 ENCOUNTER — Telehealth: Payer: Self-pay | Admitting: Radiology

## 2019-01-14 NOTE — Telephone Encounter (Signed)
Yes, we called to extend last week, but [name]'s voicemail was full. I called again today. Left message to extend orders.

## 2019-01-14 NOTE — Telephone Encounter (Signed)
[name] called to f/u and see if this patient needed to continue PT?  Please call her to advise.

## 2019-01-18 ENCOUNTER — Telehealth: Payer: Self-pay | Admitting: Orthopedic Surgery

## 2019-01-18 ENCOUNTER — Ambulatory Visit (INDEPENDENT_AMBULATORY_CARE_PROVIDER_SITE_OTHER): Payer: Medicaid Other | Admitting: Orthopedic Surgery

## 2019-01-18 ENCOUNTER — Encounter: Payer: Self-pay | Admitting: Orthopedic Surgery

## 2019-01-18 ENCOUNTER — Other Ambulatory Visit: Payer: Self-pay

## 2019-01-18 VITALS — BP 117/73 | HR 68 | Temp 97.2°F | Ht 63.0 in

## 2019-01-18 DIAGNOSIS — S72001D Fracture of unspecified part of neck of right femur, subsequent encounter for closed fracture with routine healing: Secondary | ICD-10-CM

## 2019-01-18 DIAGNOSIS — Z9889 Other specified postprocedural states: Secondary | ICD-10-CM

## 2019-01-18 NOTE — Telephone Encounter (Signed)
Call received from Verl Dicker, nurse, [place], [phone] - relays that patient missed both appointments for visits this week. Said they just were approved by Medicaid for the services at end of last week. Wanted to let Dr Aline Brochure know.

## 2019-01-18 NOTE — Progress Notes (Signed)
Chief Complaint  Patient presents with  . Follow-up    1 week recheck on right hip, DOS 12-27-18.  . Follow-up    left leg thigh     She is status post ORIF right hip dynamic hip screw 3 weeks ago she developed left thigh pain was put on Robaxin she is already on gabapentin and methadone  She is having little if any pain in her right hip her drainage subsided her left hip and thigh still hurts she wanted some more oxycodone which was declined and she was advised to seek increased pain medications through her pain clinic  She is ambulatory with a walker she is full weightbearing she is still tender on the left side  Her right hip wound looks normal  Recommend x-ray in 3 weeks

## 2019-02-08 ENCOUNTER — Ambulatory Visit: Payer: Medicaid Other | Admitting: Orthopedic Surgery

## 2019-02-13 ENCOUNTER — Ambulatory Visit: Payer: Medicaid Other

## 2019-02-13 ENCOUNTER — Encounter: Payer: Self-pay | Admitting: Orthopedic Surgery

## 2019-02-13 ENCOUNTER — Ambulatory Visit (INDEPENDENT_AMBULATORY_CARE_PROVIDER_SITE_OTHER): Payer: Medicaid Other | Admitting: Orthopedic Surgery

## 2019-02-13 ENCOUNTER — Other Ambulatory Visit: Payer: Self-pay

## 2019-02-13 DIAGNOSIS — S72001D Fracture of unspecified part of neck of right femur, subsequent encounter for closed fracture with routine healing: Secondary | ICD-10-CM

## 2019-02-13 DIAGNOSIS — Z9889 Other specified postprocedural states: Secondary | ICD-10-CM

## 2019-02-13 DIAGNOSIS — M545 Low back pain, unspecified: Secondary | ICD-10-CM

## 2019-02-13 DIAGNOSIS — S72001A Fracture of unspecified part of neck of right femur, initial encounter for closed fracture: Secondary | ICD-10-CM | POA: Insufficient documentation

## 2019-02-13 NOTE — Progress Notes (Signed)
Chief Complaint  Patient presents with  . Routine Post Op    right hip 12/27/2018 improving     Postop open treatment internal fixation right hip with dynamic hip screw week #6 patient is doing well on the fracture site still having discomfort left thigh lower back up to about L2.  Right hip wound looks normal  Left thigh pain with extension of the knee palpable tenderness approximate mid thigh proximal hip lower back as well as up to about L2  X-ray looks good  X-ray again in 6 weeks  Continue prednisone and Robaxin  Encounter Diagnoses  Name Primary?  . Status post hip surgery December 27 2018 dynamic hip screw right hip Yes  . Closed fracture of neck of right femur with routine healing, subsequent encounter   . Lumbar pain

## 2019-03-04 ENCOUNTER — Telehealth: Payer: Self-pay

## 2019-03-04 ENCOUNTER — Telehealth: Payer: Self-pay | Admitting: Orthopedic Surgery

## 2019-03-04 ENCOUNTER — Other Ambulatory Visit: Payer: Self-pay

## 2019-03-04 ENCOUNTER — Ambulatory Visit (INDEPENDENT_AMBULATORY_CARE_PROVIDER_SITE_OTHER): Payer: Medicaid Other | Admitting: Orthopedic Surgery

## 2019-03-04 DIAGNOSIS — M545 Low back pain, unspecified: Secondary | ICD-10-CM

## 2019-03-04 MED ORDER — METHOCARBAMOL 500 MG PO TABS: 500.0000 mg | ORAL_TABLET | Freq: Three times a day (TID) | ORAL | 1 refills | Status: DC

## 2019-03-04 MED ORDER — GABAPENTIN 600 MG PO TABS: 600.0000 mg | ORAL_TABLET | Freq: Three times a day (TID) | ORAL | 0 refills | Status: DC

## 2019-03-04 MED ORDER — PREDNISONE 10 MG PO TABS: ORAL_TABLET | ORAL | 0 refills | Status: DC

## 2019-03-04 NOTE — Progress Notes (Signed)
Progress Note   Patient ID: Mallory Mitchell, female   DOB: 01/06/1986, 33 y.o.   MRN: [id number]  Virtual visit patient was identified with 2 identification markers  Virtual visit was done to decrease the amount of traffic in and out of the office  CC: RIGHT LOWER BACK PAIN    Right lower back  Radiates to 2-3 days to upper thigh     FELT SOMETHING PULL WHILE SWEEPING   Review of Systems  Gastrointestinal: Negative.   Genitourinary: Negative.   Neurological: Negative for tingling.      There were no vitals taken for this visit.  Physical Exam   Medical decisions:  (Established problem worse, x-ray ,physical therapy, over-the-counter medicines, read outside film or summarize x-ray)  Data  Imaging:   None  Encounter Diagnosis  Name Primary?  . Acute right-sided low back pain without sciatica Yes    PLAN:   She does not appear to have any red flags so were going to call in some medication she will call back Friday to let us know how she did  Meds ordered this encounter  Medications  . methocarbamol (ROBAXIN) 500 MG tablet    Sig: Take 1 tablet (500 mg total) by mouth 3 (three) times daily.    Dispense:  60 tablet    Refill:  1  . gabapentin (NEURONTIN) 600 MG tablet    Sig: Take 1 tablet (600 mg total) by mouth 3 (three) times daily.    Dispense:  20 tablet    Refill:  0  . predniSONE (DELTASONE) 10 MG tablet    Sig: 20 mg 3 times daily for 2 weeks    Dispense:  84 tablet    Refill:  0       Arther Abbott, MD 03/04/2019 4:16 PM

## 2019-03-04 NOTE — Patient Instructions (Signed)
Meds ordered this encounter  Medications  . methocarbamol (ROBAXIN) 500 MG tablet    Sig: Take 1 tablet (500 mg total) by mouth 3 (three) times daily.    Dispense:  60 tablet    Refill:  1  . gabapentin (NEURONTIN) 600 MG tablet    Sig: Take 1 tablet (600 mg total) by mouth 3 (three) times daily.    Dispense:  20 tablet    Refill:  0  . predniSONE (DELTASONE) 10 MG tablet    Sig: 20 mg 3 times daily for 2 weeks    Dispense:  84 tablet    Refill:  0    CALL [facility] TO LET us KNOW IF MEDS WORKED

## 2019-03-04 NOTE — Telephone Encounter (Signed)
Patient has been scheduled accordingly; aware.

## 2019-03-04 NOTE — Telephone Encounter (Signed)
Patient left a second voice message requesting nurse or clinical staff to call her; states thinks she has pulled a muscle in her back and feels she should come in as soon as possible, today if available.  Aware her previous message with refill request is pending.  Please advise.

## 2019-03-04 NOTE — Telephone Encounter (Signed)
Methocarbamol(ROBAXIN)500 mg  Qty 60 Tablets  Take 1 tablet(500mg  total) by mouth 3 (times) times daily  PATIENT USES [facility] IN [place]  Prednisone 10 mg Tablet  Qty 84 Tablets 20 mg 3 times daily for 2 weeks.

## 2019-03-04 NOTE — Telephone Encounter (Signed)
Dr Aline Brochure states he would like virtual visit

## 2019-03-27 ENCOUNTER — Ambulatory Visit: Payer: Medicaid Other | Admitting: Orthopedic Surgery

## 2019-03-28 ENCOUNTER — Encounter: Payer: Self-pay | Admitting: Orthopedic Surgery

## 2019-04-30 ENCOUNTER — Other Ambulatory Visit: Payer: Self-pay | Admitting: Orthopedic Surgery

## 2019-04-30 DIAGNOSIS — M545 Low back pain, unspecified: Secondary | ICD-10-CM

## 2019-04-30 NOTE — Telephone Encounter (Signed)
Patient requests refill - states has no refills remaining for medication: methocarbamol (ROBAXIN) 500 MG tablet 60 tablet  -[place]*  *Note: Patient has appointment tomorrow, 05/01/19, and aware she may need to wait until appointment.

## 2019-05-01 ENCOUNTER — Ambulatory Visit: Payer: Medicaid Other

## 2019-05-01 ENCOUNTER — Other Ambulatory Visit: Payer: Self-pay

## 2019-05-01 ENCOUNTER — Ambulatory Visit (INDEPENDENT_AMBULATORY_CARE_PROVIDER_SITE_OTHER): Payer: Medicaid Other | Admitting: Orthopedic Surgery

## 2019-05-01 ENCOUNTER — Encounter: Payer: Self-pay | Admitting: Orthopedic Surgery

## 2019-05-01 ENCOUNTER — Telehealth: Payer: Self-pay | Admitting: Radiology

## 2019-05-01 ENCOUNTER — Other Ambulatory Visit: Payer: Self-pay | Admitting: Orthopedic Surgery

## 2019-05-01 VITALS — Ht 63.0 in | Wt 201.0 lb

## 2019-05-01 DIAGNOSIS — S72001D Fracture of unspecified part of neck of right femur, subsequent encounter for closed fracture with routine healing: Secondary | ICD-10-CM

## 2019-05-01 DIAGNOSIS — S7225XA Nondisplaced subtrochanteric fracture of left femur, initial encounter for closed fracture: Secondary | ICD-10-CM | POA: Diagnosis not present

## 2019-05-01 DIAGNOSIS — S72144D Nondisplaced intertrochanteric fracture of right femur, subsequent encounter for closed fracture with routine healing: Secondary | ICD-10-CM

## 2019-05-01 DIAGNOSIS — T84498A Other mechanical complication of other internal orthopedic devices, implants and grafts, initial encounter: Secondary | ICD-10-CM | POA: Diagnosis not present

## 2019-05-01 MED ORDER — METHOCARBAMOL 500 MG PO TABS: 500.0000 mg | ORAL_TABLET | Freq: Three times a day (TID) | ORAL | 1 refills | Status: DC

## 2019-05-01 NOTE — Telephone Encounter (Signed)
Patient called to check status on appt at [facility], please call her to advise.

## 2019-05-01 NOTE — Progress Notes (Signed)
Chief Complaint  Patient presents with  . Leg Pain    bilateral legs painful from waist down had fall 2 weeks ago severe pain since     Status post ORIF right hip December 27, 2018 dynamic hip screw  Patient fell 2 weeks ago increased pain right leg  Chronic pain left leg  Patient comes in ambulating with a walker and severe pain  History of chronic pain on methadone 60 mg every morning  Past Medical History:  Diagnosis Date  . Depression   . Myasthenia gravis ([place])   . Seizures ([place])    Past Surgical History:  Procedure Laterality Date  . COMPRESSION HIP SCREW Right 12/27/2018   Procedure: OPEN REDUCTION INTERNAL FIXATION RIGHT HIP;  Surgeon: Carole Civil, MD;  Location: [facility];  Service: Orthopedics;  Laterality: Right;  . MOUTH SURGERY       Current Outpatient Medications:  .  acetaminophen (TYLENOL) 325 MG tablet, Take 2 tablets (650 mg total) by mouth every 6 (six) hours as needed for mild pain, fever or headache (or Fever >/= 101)., Disp: 30 tablet, Rfl: 1 .  celecoxib (CELEBREX) 200 MG capsule, Take 1 capsule (200 mg total) by mouth 2 (two) times daily with a meal., Disp: 60 capsule, Rfl: 1 .  CELLCEPT 500 MG tablet, Take 500 mg by mouth 2 (two) times daily., Disp: , Rfl:  .  gabapentin (NEURONTIN) 600 MG tablet, Take 1 tablet (600 mg total) by mouth 3 (three) times daily., Disp: 20 tablet, Rfl: 0 .  methadone (DOLOPHINE) 10 MG/ML solution, Take 60 mg by mouth every morning., Disp: , Rfl:  .  methocarbamol (ROBAXIN) 500 MG tablet, Take 1 tablet (500 mg total) by mouth 3 (three) times daily., Disp: 60 tablet, Rfl: 1 .  pantoprazole (PROTONIX) 40 MG tablet, Take 1 tablet (40 mg total) by mouth daily., Disp: 30 tablet, Rfl: 1 .  pyridostigmine (MESTINON) 60 MG tablet, Take 30 mg by mouth 3 (three) times daily., Disp: , Rfl:    Clinical exam shows tenderness in the right proximal lateral hip  And tenderness in the mid left thigh  X-ray shows right side, broken  plate with intact dynamic hip screw and healed right inner troches fracture  Left side, healing subtroches femur fracture with bridging callus, this looks like an insufficiency fracture.  Patient has been on chronic steroids for her myasthenia gravis may be cause for subdural fracture left side  Patient also has tenderness in the lower back with chronic lower back pain.  Neurovascular exam remains intact    Encounter Diagnoses  Name Primary?  . Closed nondisplaced intertrochanteric fracture of right femur with routine healing, subsequent encounter   . Closed nondisplaced subtrochanteric fracture of left femur, initial encounter ([place])   . Failed orthopedic implant, initial encounter [facility]) Yes    My recommendation is for the patient to continue weightbearing with the walker  I will see if Mina Marble will consider taking her for hardware removal fracture evaluation possible intramedullary nailing on the right and then possible intramedullary nailing on the left  Patient has 2 children aged 1 and 5 and no help

## 2019-05-01 NOTE — Patient Instructions (Addendum)
You will need to go to [facility] for two fractures 1 in each leg   ([id number])

## 2019-05-02 ENCOUNTER — Telehealth: Payer: Self-pay | Admitting: Orthopedic Surgery

## 2019-05-02 NOTE — Telephone Encounter (Signed)
Patient called (1) to ask if Dr Aline Brochure would consider ordering a prescription for a non-narcotic medication, possibly Tramadol or Gabapentin - states uses [facility] (2) to relay that she has an appointment at [facility] on Tuesday, 05/07/19; aware she is to pick up CD of her Xrays done here 05/01/19. Dr Aline Brochure personally brought in the CD; ready for pick up at front desk.* *Note*per Dr Aline Brochure: Patient was offered an appointment for today, 05/02/19 or tomorrow 05/03/19 at [facility], per Dr Aline Brochure with Dr San Jetty.

## 2019-05-02 NOTE — Telephone Encounter (Signed)
I just spoke with Dr. San Jetty and reviewed the case with Dr. San Jetty summary broken hardware status post open treatment internal fixation right hip with dynamic hip screw on October 1  X-rays on February 3 show broken hardware on the right with healed base of neck femoral neck fracture right hip  Also has left subtroches fracture  I spoke with Olivia Mackie and let her know that Dr. Jolyn Nap office will call the patient and set up an appointment for today or tomorrow and I will get the disc burned for her.  She will pick it up from the office.  Olivia Mackie is aware

## 2019-05-02 NOTE — Telephone Encounter (Signed)
Update  Patient alerted that we are waiting word from [facility] for consult regarding her condition

## 2019-05-03 ENCOUNTER — Encounter: Payer: Self-pay | Admitting: Orthopedic Surgery

## 2019-05-03 ENCOUNTER — Emergency Department (HOSPITAL_COMMUNITY)
Admission: EM | Admit: 2019-05-03 | Discharge: 2019-05-03 | Disposition: A | Discharge status: Other Acute Inpatient Hospital | Payer: Medicaid Other | Attending: Emergency Medicine | Admitting: Emergency Medicine

## 2019-05-03 ENCOUNTER — Emergency Department (HOSPITAL_COMMUNITY): Payer: Medicaid Other

## 2019-05-03 ENCOUNTER — Other Ambulatory Visit: Payer: Self-pay

## 2019-05-03 ENCOUNTER — Other Ambulatory Visit: Payer: Self-pay | Admitting: Orthopedic Surgery

## 2019-05-03 ENCOUNTER — Encounter (HOSPITAL_COMMUNITY): Payer: Self-pay

## 2019-05-03 ENCOUNTER — Ambulatory Visit: Payer: Medicaid Other | Admitting: Orthopedic Surgery

## 2019-05-03 DIAGNOSIS — S72002S Fracture of unspecified part of neck of left femur, sequela: Secondary | ICD-10-CM | POA: Diagnosis not present

## 2019-05-03 DIAGNOSIS — X58XXXS Exposure to other specified factors, sequela: Secondary | ICD-10-CM | POA: Insufficient documentation

## 2019-05-03 DIAGNOSIS — Z8781 Personal history of (healed) traumatic fracture: Secondary | ICD-10-CM

## 2019-05-03 DIAGNOSIS — Z79899 Other long term (current) drug therapy: Secondary | ICD-10-CM | POA: Diagnosis not present

## 2019-05-03 DIAGNOSIS — M545 Low back pain, unspecified: Secondary | ICD-10-CM

## 2019-05-03 DIAGNOSIS — S79912S Unspecified injury of left hip, sequela: Secondary | ICD-10-CM | POA: Diagnosis present

## 2019-05-03 DIAGNOSIS — F17210 Nicotine dependence, cigarettes, uncomplicated: Secondary | ICD-10-CM | POA: Diagnosis not present

## 2019-05-03 LAB — CBC
HCT: 37.5 % (ref 36.0–46.0)
Hemoglobin: 11.5 g/dL — ABNORMAL LOW (ref 12.0–15.0)
MCH: 24.4 pg — ABNORMAL LOW (ref 26.0–34.0)
MCHC: 30.7 g/dL (ref 30.0–36.0)
MCV: 79.6 fL — ABNORMAL LOW (ref 80.0–100.0)
Platelets: 415 10*3/uL — ABNORMAL HIGH (ref 150–400)
RBC: 4.71 MIL/uL (ref 3.87–5.11)
RDW: 17 % — ABNORMAL HIGH (ref 11.5–15.5)
WBC: 13.1 10*3/uL — ABNORMAL HIGH (ref 4.0–10.5)
nRBC: 0 % (ref 0.0–0.2)

## 2019-05-03 LAB — BASIC METABOLIC PANEL
Anion gap: 10 (ref 5–15)
BUN: 18 mg/dL (ref 6–20)
CO2: 23 mmol/L (ref 22–32)
Calcium: 9.6 mg/dL (ref 8.9–10.3)
Chloride: 101 mmol/L (ref 98–111)
Creatinine, Ser: 0.55 mg/dL (ref 0.44–1.00)
GFR calc Af Amer: >60 mL/min (ref >60)
GFR calc non Af Amer: >60 mL/min (ref >60)
Glucose, Bld: 84 mg/dL (ref 70–99)
Potassium: 4 mmol/L (ref 3.5–5.1)
Sodium: 134 mmol/L — ABNORMAL LOW (ref 135–145)

## 2019-05-03 MED ORDER — HYDROMORPHONE HCL 2 MG/ML IJ SOLN
2.0000 mg | Freq: Once | INTRAMUSCULAR | Status: AC | PRN
  Administered 2019-05-03: 12:00:00 2 mg via INTRAVENOUS
  Filled 2019-05-03: qty 1

## 2019-05-03 MED ORDER — GABAPENTIN 600 MG PO TABS: 600.0000 mg | ORAL_TABLET | Freq: Three times a day (TID) | ORAL | 0 refills | Status: DC

## 2019-05-03 MED ORDER — HYDROMORPHONE HCL 2 MG/ML IJ SOLN
2.0000 mg | Freq: Once | INTRAMUSCULAR | Status: AC
  Administered 2019-05-03: 10:00:00 2 mg via INTRAVENOUS
  Filled 2019-05-03: qty 1

## 2019-05-03 MED ORDER — HYDROMORPHONE HCL 2 MG/ML IJ SOLN
2.0000 mg | Freq: Once | INTRAMUSCULAR | Status: AC
  Administered 2019-05-03: 14:00:00 2 mg via INTRAVENOUS
  Filled 2019-05-03: qty 1

## 2019-05-03 NOTE — ED Notes (Signed)
Patient picked up by [facility] ambulance.  All belongings with patient. Family to come pick up wheelchair.

## 2019-05-03 NOTE — Progress Notes (Unsigned)
ER CALLED ABOUT Mallory Mitchell  She has fractured the left femur in the subtrochanteric region   I m not available this week-end   I asked him to see if they could get Dr San Jetty on the phone to take the patient there for treatment   See my note from yesterday:  I just spoke with Dr. San Jetty and reviewed the case with Dr. San Jetty summary broken hardware status post open treatment internal fixation right hip with dynamic hip screw on October 1   X-rays on February 3 show broken hardware on the right with healed base of neck femoral neck fracture right hip   Also has left subtroches fracture   I spoke with Olivia Mackie and let her know that Dr. Jolyn Nap office will call the patient and set up an appointment for today or tomorrow and I will get the disc burned for her.  She will pick it up from the office.   Olivia Mackie is aware

## 2019-05-03 NOTE — ED Provider Notes (Signed)
[facility] EMERGENCY DEPARTMENT Provider Note   CSN: [id number] Arrival date & time: 05/03/19  [id number]     History Chief Complaint  Patient presents with  . Leg Pain    Mallory Mitchell is a 34 y.o. female w/ hx of myasthenia gravis, opioid dependence on methadone, ORIF right hip Dec 27 2018 w/ dynamic hip screw c/b broken plate, hx of left subtroch femur fracture, on chronic steroids for MG, presenting to the emergency department left hip pain.  The patient reports he was on her way to her methadone clinic this morning.  She states that the gentleman that was pushing her wheelchair accidentally jostled the wheelchair.  She had immediate pain in her left hip.  She the pain was severe and 10 out of 10.  She could not walk.  She is concerned she may have worsened her left hip fracture which has been healing.  She reported initial paresthesias in her left foot but these appear to have improved.  She is complaining of 10 out of 10 pain located in her left hip and femur region.  She follows with Dr Aline Brochure of orthopedics.  She had an appointment scheduled with Dr San Jetty (sp?) of [facility] orthopedics today as a referral for possible repair of her right hip plate, but missed that appointment, telling me she did not know it was today.  NKDA  HPI     Past Medical History:  Diagnosis Date  . Depression   . Myasthenia gravis ([place])   . Seizures [facility])     Patient Active Problem List   Diagnosis Date Noted  . Closed fracture of neck of right femur ([place]) 02/13/2019  . Status post hip surgery December 27 2018 dynamic hip screw right hip 01/09/2019  . Closed fracture of right hip ([place]) 01/09/2019  . Femoral neck fracture ([place]) 12/26/2018  . Encounter for other specified antenatal screening 06/13/2017  . Opioid dependence on agonist therapy ([place]) 03/17/2014  . Premature delivery before 37 weeks 03/17/2014  . Premature rupture of membranes with antenatal problem 03/02/2014  . Preterm premature rupture of  membranes (PPROM) with onset of labor within 24 hours of rupture in second trimester, antepartum 03/02/2014  . Rh negative status during pregnancy in second trimester 12/06/2013  . Seizure disorder ([place]) 12/06/2013    Past Surgical History:  Procedure Laterality Date  . COMPRESSION HIP SCREW Right 12/27/2018   Procedure: OPEN REDUCTION INTERNAL FIXATION RIGHT HIP;  Surgeon: Carole Civil, MD;  Location: [facility];  Service: Orthopedics;  Laterality: Right;  . MOUTH SURGERY       OB History   No obstetric history on file.     Family History  Problem Relation Age of Onset  . Liver cancer Mother     Social History   Tobacco Use  . Smoking status: Current Some Day Smoker    Packs/day: 0.50    Types: Cigarettes  . Smokeless tobacco: Never Used  Substance Use Topics  . Alcohol use: Yes    Comment: occ  . Drug use: Not Currently    Types: Cocaine, Marijuana    Comment: methadone    Home Medications Prior to Admission medications   Medication Sig Start Date End Date Taking? Authorizing Provider  acetaminophen (TYLENOL) 325 MG tablet Take 2 tablets (650 mg total) by mouth every 6 (six) hours as needed for mild pain, fever or headache (or Fever >/= 101). 12/28/18  Yes Emokpae, Courage, MD  celecoxib (CELEBREX) 200 MG capsule Take 1  capsule (200 mg total) by mouth 2 (two) times daily with a meal. 12/28/18  Yes Emokpae, Courage, MD  CELLCEPT 500 MG tablet Take 500 mg by mouth 2 (two) times daily. 03/26/19  Yes [provider]  gabapentin (NEURONTIN) 600 MG tablet Take 1 tablet (600 mg total) by mouth 3 (three) times daily. 05/03/19  Yes Carole Civil, MD  hydrOXYzine (ATARAX/VISTARIL) 25 MG tablet Take 25 mg by mouth 3 (three) times daily as needed for anxiety.   Yes [provider]  methadone (DOLOPHINE) 10 MG/ML solution Take 100 mg by mouth every morning.    Yes [provider]  methocarbamol (ROBAXIN) 500 MG tablet Take 1 tablet (500 mg total) by  mouth 3 (three) times daily. 05/01/19  Yes Carole Civil, MD  pantoprazole (PROTONIX) 40 MG tablet Take 1 tablet (40 mg total) by mouth daily. 12/28/18 12/28/19 Yes Emokpae, Courage, MD  pyridostigmine (MESTINON) 60 MG tablet Take 30 mg by mouth 3 (three) times daily.   Yes [provider]    Allergies    Patient has no known allergies.  Review of Systems   Review of Systems  Constitutional: Negative for chills and fever.  Gastrointestinal: Positive for nausea. Negative for vomiting.  Genitourinary: Negative for dysuria and hematuria.  Musculoskeletal: Positive for arthralgias, back pain, gait problem and myalgias.  Neurological: Negative for syncope and light-headedness.  All other systems reviewed and are negative.   Physical Exam Updated Vital Signs BP 120/89   Pulse (!) 104   Temp 97.9 F (36.6 C) (Oral)   Resp (!) 22   Ht 5\' 3"  (1.6 m)   Wt 79.4 kg   SpO2 98%   BMI 31.00 kg/m   Physical Exam Vitals and nursing note reviewed.  Constitutional:      General: She is in acute distress.     Appearance: She is well-developed.  HENT:     Head: Normocephalic and atraumatic.  Eyes:     Conjunctiva/sclera: Conjunctivae normal.  Cardiovascular:     Rate and Rhythm: Regular rhythm. Tachycardia present.     Pulses: Normal pulses.  Pulmonary:     Effort: Pulmonary effort is normal. No respiratory distress.  Musculoskeletal:     Cervical back: Neck supple.     Comments: No gross disfigurement of left hip Pain with palpation of left thigh and any movement of left leg Compartments in leg are soft  Skin:    General: Skin is warm and dry.  Neurological:     General: No focal deficit present.     Mental Status: She is alert and oriented to person, place, and time.     Comments: Sensation in distal left leg intact Neurological exam in left leg intact     ED Results / Procedures / Treatments   Labs (all labs ordered are listed, but only abnormal results are  displayed) Labs Reviewed  BASIC METABOLIC PANEL - Abnormal; Notable for the following components:      Result Value   Sodium 134 (*)    All other components within normal limits  CBC - Abnormal; Notable for the following components:   WBC 13.1 (*)    Hemoglobin 11.5 (*)    MCV 79.6 (*)    MCH 24.4 (*)    RDW 17.0 (*)    Platelets 415 (*)    All other components within normal limits    EKG None  Radiology DG Pelvis Portable  Result Date: 05/03/2019 CLINICAL DATA:  [place].  Pelvic pain. History of right hip fracture fixation 12/27/2018. EXAM: PORTABLE PELVIS 1-2 VIEWS COMPARISON:  CT pelvis 12/27/2018 FINDINGS: Displaced intertrochanteric fracture of the left hip is noted with significant varus deformity. The right hip hardware is fractured. The 3 screws are all fractured at the level of the sideplate. The pubic symphysis and SI joints are intact. No definite pelvic fractures. IMPRESSION: 1. Displaced intertrochanteric fracture of the left hip with varus deformity. 2. Fractures of the right hip hardware screws. The plate is displaced from the lateral femoral cortex. 3. Intact bony pelvis. Electronically Signed   By: Marijo Sanes M.D.   On: 05/03/2019 11:21   DG Femur Portable 1 View Left  Result Date: 05/03/2019 CLINICAL DATA:  [place]. EXAM: LEFT FEMUR PORTABLE 1 VIEW COMPARISON:  Radiograph 01/04/2019 FINDINGS: Displaced intertrochanteric fracture with varus deformity. IMPRESSION: Displaced intertrochanteric fracture. Electronically Signed   By: Marijo Sanes M.D.   On: 05/03/2019 11:22    Procedures Procedures (including critical care time)  Medications Ordered in ED Medications  HYDROmorphone (DILAUDID) injection 2 mg (2 mg Intravenous Given 05/03/19 0948)  HYDROmorphone (DILAUDID) injection 2 mg (2 mg Intravenous Given 05/03/19 1145)  HYDROmorphone (DILAUDID) injection 2 mg (2 mg Intravenous Given 05/03/19 1339)    ED Course  I have reviewed the triage vital signs and the nursing  notes.  Pertinent labs & imaging results that were available during my care of the patient were reviewed by me and considered in my medical decision making (see chart for details).  34 yo female presenting to ED with left hip pain after being jostled or falling out of her wheelchair this morning.  She has a hx of a healing subtroch fracture of this left hip, being monitored by Dr Aline Brochure - in addition to her right hip fracture from Oct 2020, s/p plate via ORIF, now needing hardware repair.    Her xray here is showing displaced possibly intertrochanteric fracture, per the limited views we were able to obtain.  It's difficult to ascertain how this compares to her prior fracture, as we do not have her most recent outpatient films on our file.  However, the the patient does examine to me like someone with an acute fracture.  She is in significant distress from pain despite multiple doses of IV dilaudid, and I will continue attempting to treat this pain with strong analgesia.    Clinical Course as of May 02 1940  Fri May 03, 2019  1305 Still in 10/10 pain, wide awake, does not appear to have been affected by the dilaudid thus far.  I offered alternative medication (ketamine) but she does not want this.  Will give another dose dilaudid.  I also spoke to Dr. Marquita Palms at [facility] whom the patient was scheduled to see today for repair of her right femur.  Dr Aurelio Jew group is okay with evaluating the patient's left femur but has requested ED to ED transfer to [facility] to determine the acuity of this left fracture.   [MT]  [id number] Accepted to [facility] ED by Dr Val Eagle   [MT]    Clinical Course User Index [MT] Langston Masker, Carola Rhine, MD    Final Clinical Impression(s) / ED Diagnoses Final diagnoses:  Closed left hip fracture, sequela    Rx / DC Orders ED Discharge Orders    None       Langston Masker Carola Rhine, MD 05/03/19 [phone]

## 2019-05-03 NOTE — ED Triage Notes (Signed)
Pt here for left leg pain. Pt states she has broke both legs and has plate in her right leg and needs surgery on her left leg. Pt states this morning she was being taken to the methadone clinic this morning and was jolted coming down the steps in the wheelchair. Pt screaming with horrible pain. Pt needed assistance getting out of wheelchair and van.

## 2019-05-03 NOTE — Telephone Encounter (Signed)
Gabapentin ordered

## 2019-05-06 MED ORDER — OXYCODONE HCL 5 MG PO TABS
5.00 | ORAL_TABLET | ORAL | Status: DC
Start: ? — End: 2019-05-06

## 2019-05-06 MED ORDER — GENERIC EXTERNAL MEDICATION
30.00 | Status: DC
Start: 2019-05-06 — End: 2019-05-06

## 2019-05-06 MED ORDER — KETAMINE HCL 10 MG/ML IJ SOLN
20.00 | INTRAMUSCULAR | Status: DC
Start: 2019-05-06 — End: 2019-05-06

## 2019-05-06 MED ORDER — ACETAMINOPHEN 500 MG PO TABS
1000.00 | ORAL_TABLET | ORAL | Status: DC
Start: 2019-05-06 — End: 2019-05-06

## 2019-05-06 MED ORDER — SENNOSIDES-DOCUSATE SODIUM 8.6-50 MG PO TABS
2.00 | ORAL_TABLET | ORAL | Status: DC
Start: 2019-05-06 — End: 2019-05-06

## 2019-05-06 MED ORDER — MYCOPHENOLATE MOFETIL 500 MG PO TABS
500.00 | ORAL_TABLET | ORAL | Status: DC
Start: 2019-05-06 — End: 2019-05-06

## 2019-05-06 MED ORDER — CELECOXIB 200 MG PO CAPS
200.00 | ORAL_CAPSULE | ORAL | Status: DC
Start: 2019-05-06 — End: 2019-05-06

## 2019-05-06 MED ORDER — HYDROXYZINE HCL 25 MG PO TABS
50.00 | ORAL_TABLET | ORAL | Status: DC
Start: 2019-05-06 — End: 2019-05-06

## 2019-05-06 MED ORDER — ESCITALOPRAM OXALATE 10 MG PO TABS
10.00 | ORAL_TABLET | ORAL | Status: DC
Start: 2019-05-07 — End: 2019-05-06

## 2019-05-06 MED ORDER — METHOCARBAMOL 500 MG PO TABS
1000.00 | ORAL_TABLET | ORAL | Status: DC
Start: 2019-05-06 — End: 2019-05-06

## 2019-05-06 MED ORDER — METHADONE HCL 10 MG/ML PO CONC
100.00 | ORAL | Status: DC
Start: 2019-05-07 — End: 2019-05-06

## 2019-05-06 MED ORDER — ASPIRIN 81 MG PO TBEC
81.00 | DELAYED_RELEASE_TABLET | ORAL | Status: DC
Start: 2019-05-06 — End: 2019-05-06

## 2019-05-06 MED ORDER — PNEUMOCOCCAL VAC POLYVALENT 25 MCG/0.5ML IJ INJ
0.50 | INJECTION | INTRAMUSCULAR | Status: DC
Start: ? — End: 2019-05-06

## 2019-05-06 MED ORDER — PANTOPRAZOLE SODIUM 40 MG PO TBEC
40.00 | DELAYED_RELEASE_TABLET | ORAL | Status: DC
Start: 2019-05-07 — End: 2019-05-06

## 2019-05-06 MED ORDER — ENEMA 7-19 GM/118ML RE ENEM
1.00 | ENEMA | RECTAL | Status: DC
Start: ? — End: 2019-05-06

## 2019-05-06 MED ORDER — BISACODYL 10 MG RE SUPP
10.00 | RECTAL | Status: DC
Start: ? — End: 2019-05-06

## 2019-05-06 MED ORDER — ERGOCALCIFEROL 1.25 MG (50000 UT) PO CAPS
50000.00 | ORAL_CAPSULE | ORAL | Status: DC
Start: 2019-05-13 — End: 2019-05-06

## 2019-05-06 MED ORDER — GABAPENTIN 400 MG PO CAPS
800.00 | ORAL_CAPSULE | ORAL | Status: DC
Start: 2019-05-06 — End: 2019-05-06

## 2019-05-06 MED ORDER — CALCIUM CITRATE 950 (200 CA) MG PO TABS
950.00 | ORAL_TABLET | ORAL | Status: DC
Start: 2019-05-07 — End: 2019-05-06

## 2019-08-04 ENCOUNTER — Inpatient Hospital Stay
Admission: AD | Admit: 2019-08-04 | Payer: Medicaid Other | Source: Other Acute Inpatient Hospital | Admitting: Pulmonary Disease

## 2019-08-07 MED ORDER — CELECOXIB 200 MG PO CAPS
200.00 | ORAL_CAPSULE | ORAL | Status: DC
Start: 2019-08-07 — End: 2019-08-07

## 2019-08-07 MED ORDER — HYDROMORPHONE HCL 1 MG/ML IJ SOLN
0.80 | INTRAMUSCULAR | Status: DC
Start: ? — End: 2019-08-07

## 2019-08-07 MED ORDER — KETAMINE HCL 10 MG/ML IJ SOLN
20.00 | INTRAMUSCULAR | Status: DC
Start: 2019-08-07 — End: 2019-08-07

## 2019-08-07 MED ORDER — PANTOPRAZOLE SODIUM 40 MG PO TBEC
40.00 | DELAYED_RELEASE_TABLET | ORAL | Status: DC
Start: 2019-08-07 — End: 2019-08-07

## 2019-08-07 MED ORDER — MUPIROCIN 2 % EX OINT
TOPICAL_OINTMENT | CUTANEOUS | Status: DC
Start: 2019-08-07 — End: 2019-08-07

## 2019-08-07 MED ORDER — OXYCODONE HCL 5 MG PO TABS
10.00 | ORAL_TABLET | ORAL | Status: DC
Start: ? — End: 2019-08-07

## 2019-08-07 MED ORDER — ESCITALOPRAM OXALATE 10 MG PO TABS
10.00 | ORAL_TABLET | ORAL | Status: DC
Start: 2019-08-07 — End: 2019-08-07

## 2019-08-07 MED ORDER — GENERIC EXTERNAL MEDICATION
30.00 | Status: DC
Start: 2019-08-07 — End: 2019-08-07

## 2019-08-07 MED ORDER — HYDROXYZINE HCL 25 MG PO TABS
50.00 | ORAL_TABLET | ORAL | Status: DC
Start: 2019-08-07 — End: 2019-08-07

## 2019-08-07 MED ORDER — GENERIC EXTERNAL MEDICATION
800.00 | Status: DC
Start: 2019-08-07 — End: 2019-08-07

## 2019-08-07 MED ORDER — METHOCARBAMOL 500 MG PO TABS
1000.00 | ORAL_TABLET | ORAL | Status: DC
Start: 2019-08-07 — End: 2019-08-07

## 2019-08-07 MED ORDER — ACETAMINOPHEN 500 MG PO TABS
1000.00 | ORAL_TABLET | ORAL | Status: DC
Start: 2019-08-07 — End: 2019-08-07

## 2019-08-07 MED ORDER — GENERIC EXTERNAL MEDICATION
10.00 | Status: DC
Start: ? — End: 2019-08-07

## 2019-08-07 MED ORDER — METHADONE HCL 10 MG/ML PO CONC
100.00 | ORAL | Status: DC
Start: 2019-08-07 — End: 2019-08-07

## 2019-08-07 MED ORDER — SENNOSIDES-DOCUSATE SODIUM 8.6-50 MG PO TABS
2.00 | ORAL_TABLET | ORAL | Status: DC
Start: 2019-08-07 — End: 2019-08-07

## 2019-09-04 NOTE — ED Notes (Signed)
Called Pt about wheelchair that was left here.  She said someone will pick it up in 2 to 3 days(boyfriend). Located in EMS rm.

## 2021-02-01 IMAGING — DX DG HIP (WITH OR WITHOUT PELVIS) 2-3V*R*
3 series · 4 of 4 positions shown · non-contrast
Comparison: Right hip radiograph dated 12/20/2018

CLINICAL DATA: 32-year-old female with fall and right hip pain.

EXAM:
DG HIP (WITH OR WITHOUT PELVIS) 2-3V RIGHT

[pelvis ap]
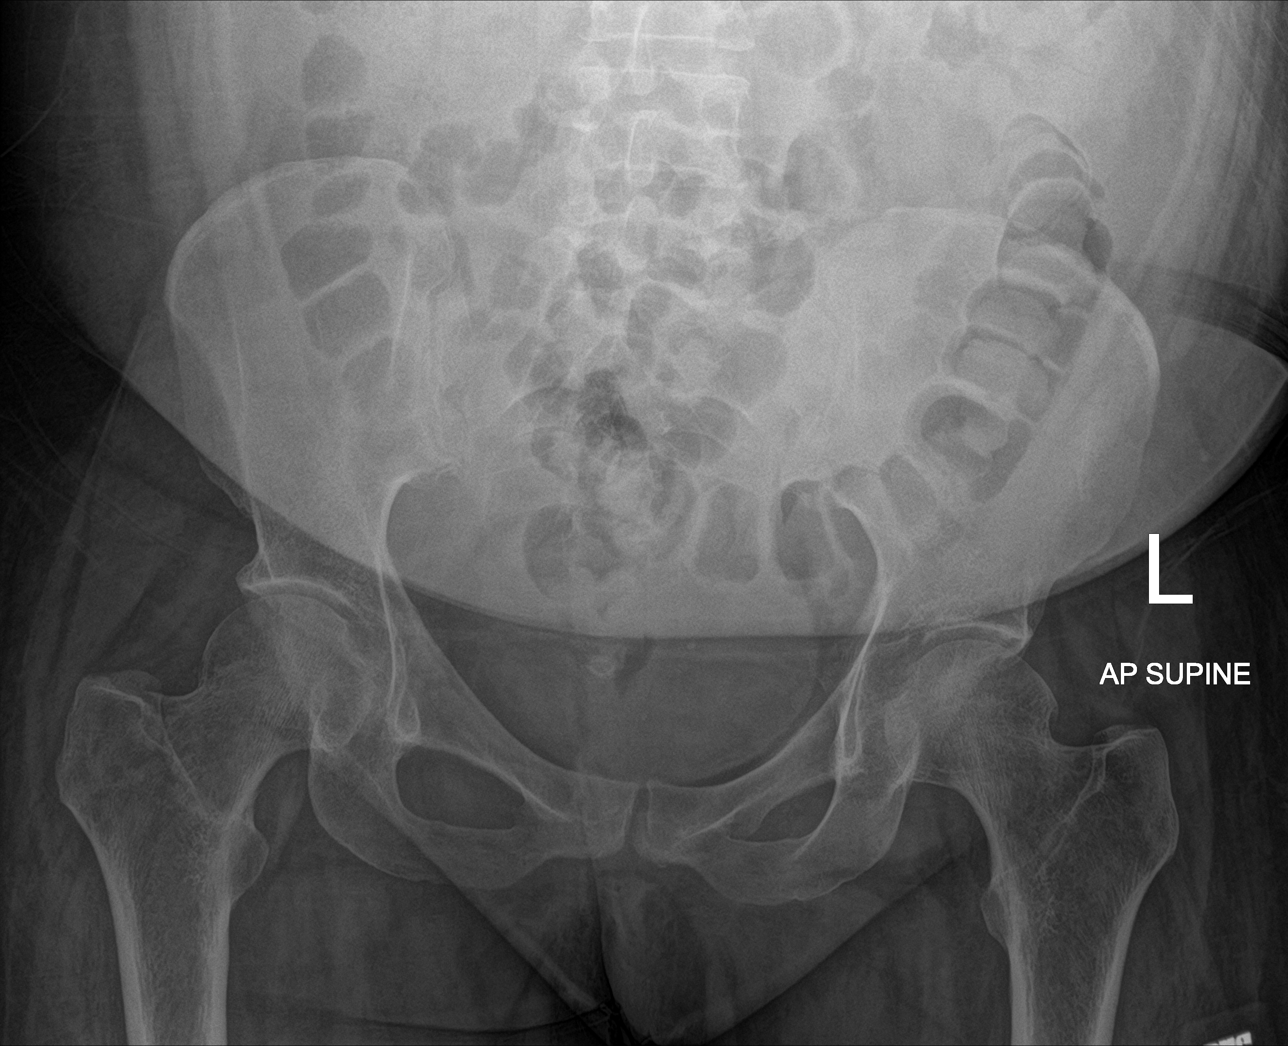

[hip ap]
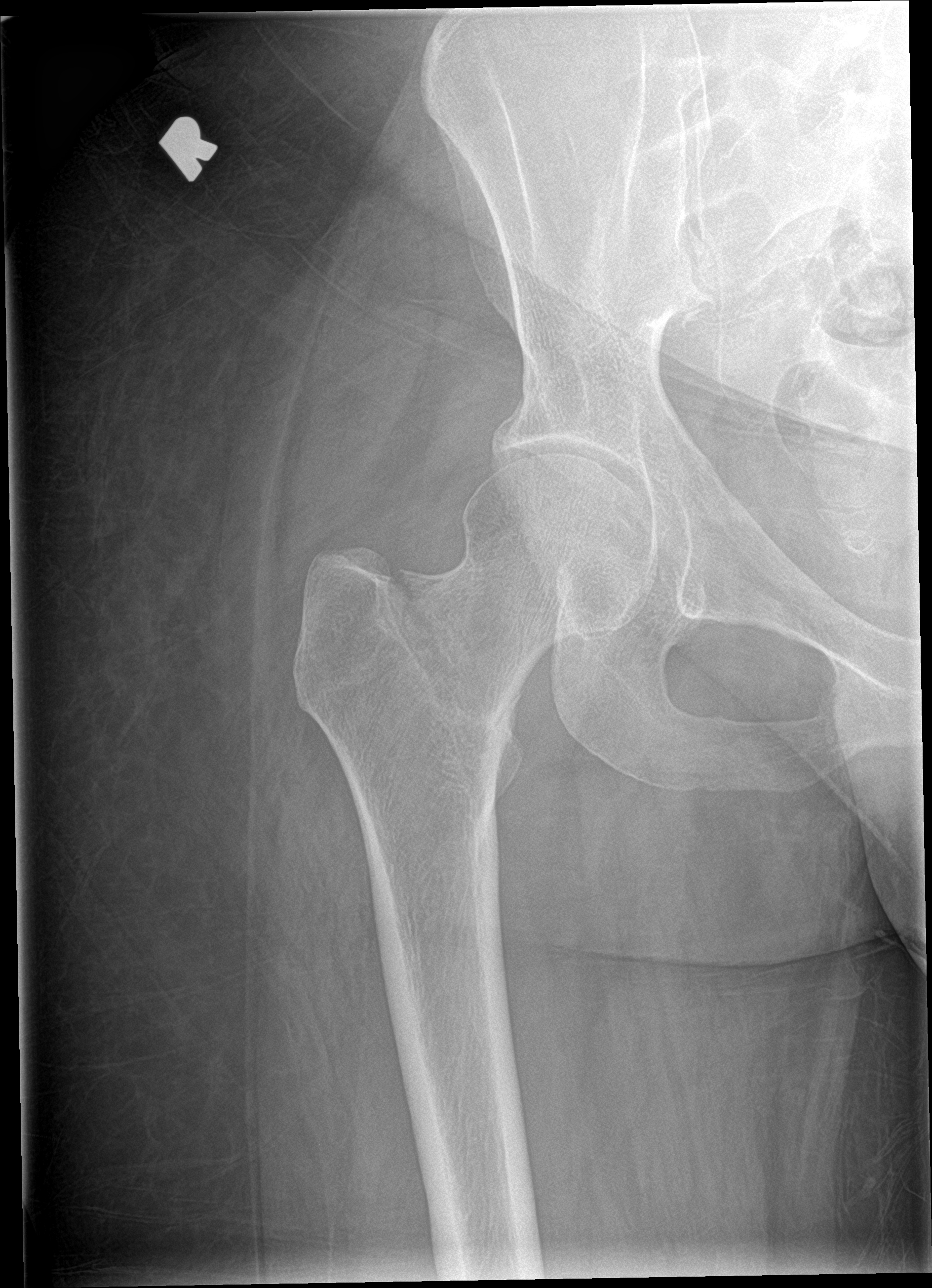

[Series 3: hip lat · 0.14mm/px · 2 of 2 slices shown]
[im 1/2]
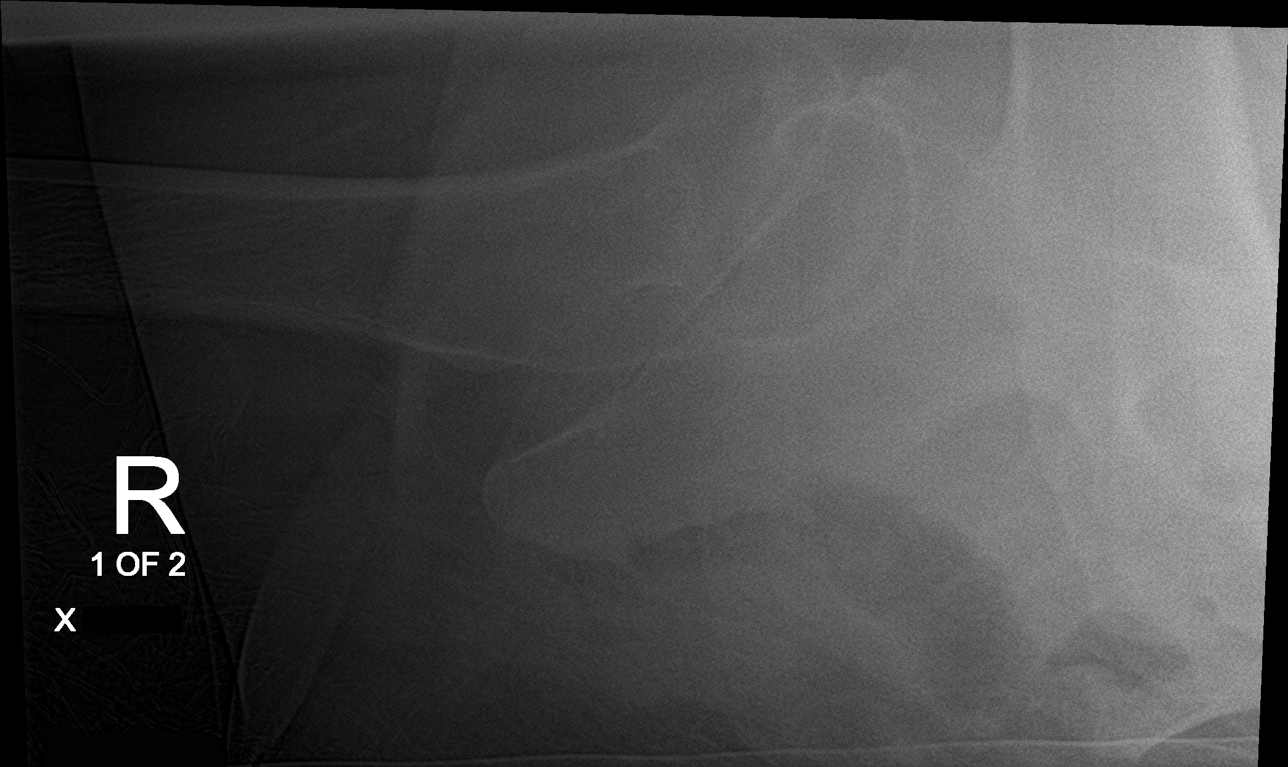
[im 2/2]
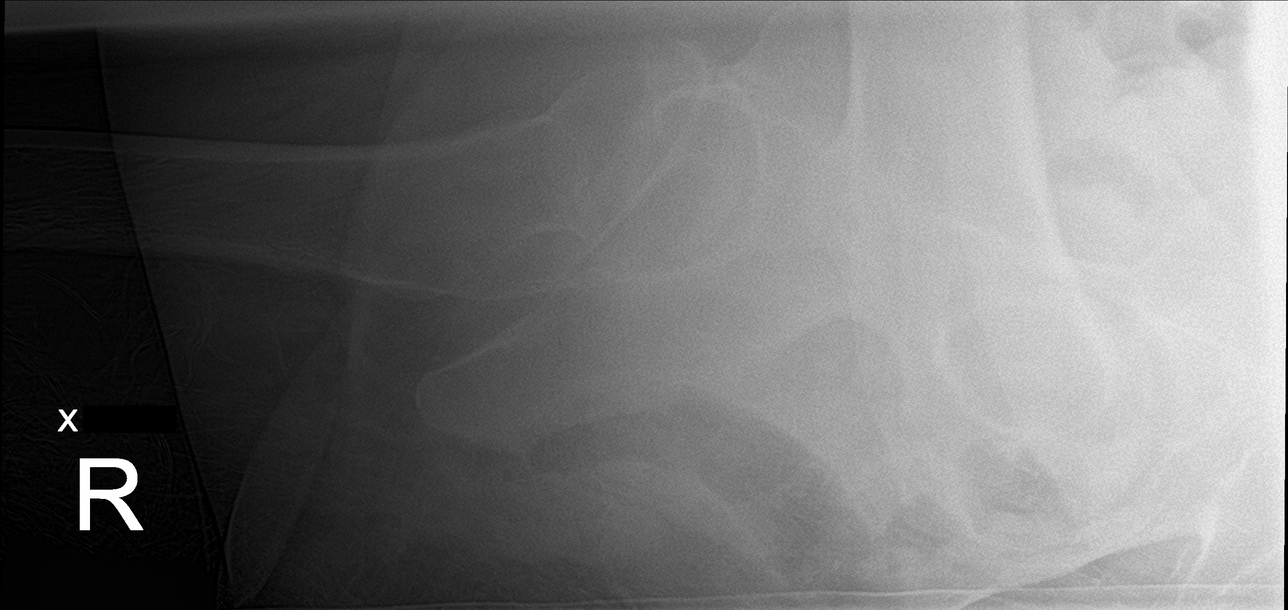

[4 of 4 positions shown; findings below may reference images not displayed]

FINDINGS: Nondisplaced basicervical fracture of the right femoral neck. There
is no dislocation. The bones are somewhat osteopenic for age. The
soft tissues are unremarkable.
IMPRESSION: Nondisplaced fracture of the right femoral neck. No dislocation.

## 2021-02-01 IMAGING — CT CT PELVIS W/O CM
2 of 3 series · 17 of 46 positions shown, 19 images · non-contrast
Comparison: 12/20/2018

CLINICAL DATA: Right-sided hip pain with negative films, subsequent
encounter

EXAM:
CT PELVIS WITHOUT CONTRAST
TECHNIQUE: Multidetector CT imaging of the pelvis was performed following the
standard protocol without intravenous contrast.

[Series 2: axial st · axial · 0.77mm/px · z∈[+764,+1020]mm · 14 of 59 slices shown, 16 images]
[im 4/59  soft-tissue]
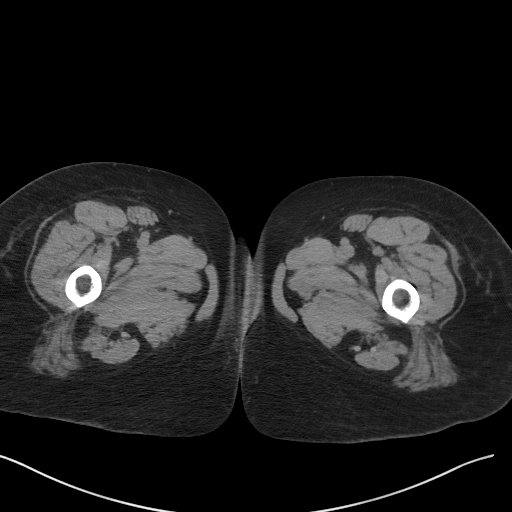
[im 4/59  bone]
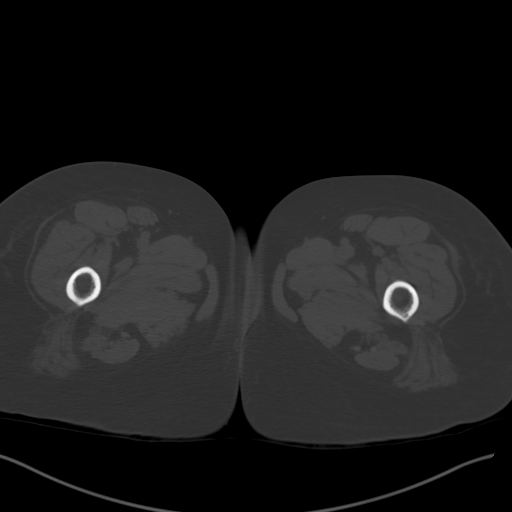
[im 8/59  soft-tissue]
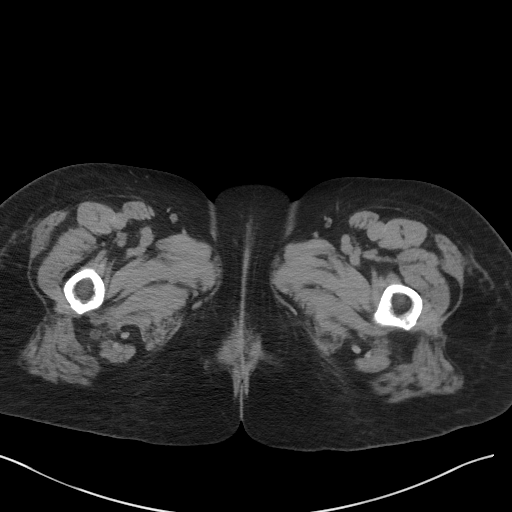
[im 12/59  soft-tissue]
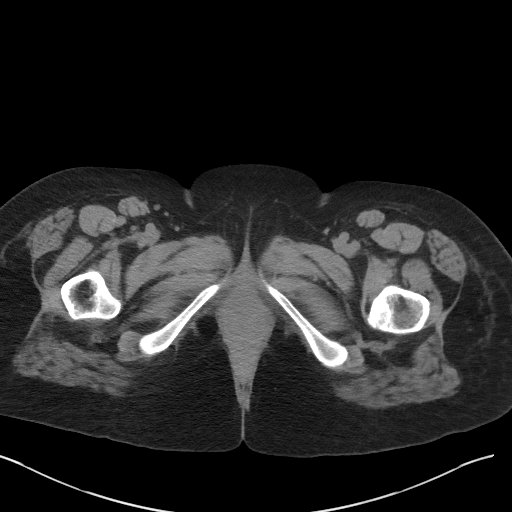
[im 15/59  soft-tissue]
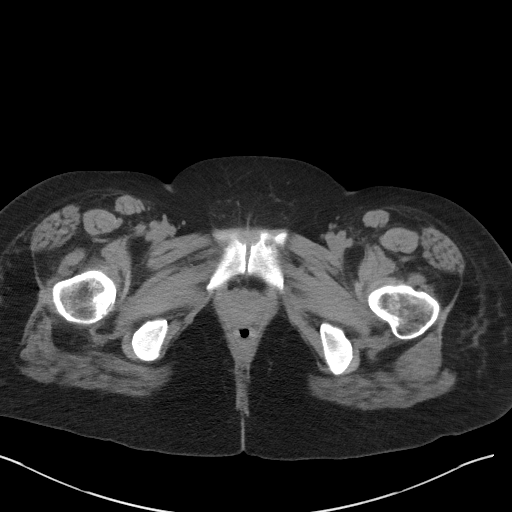
[im 19/59  soft-tissue]
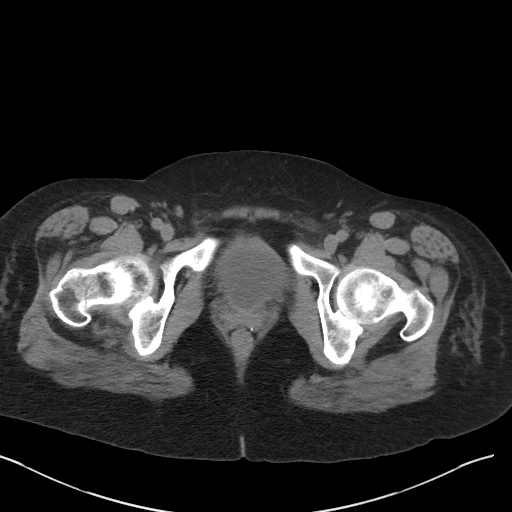
[im 23/59  soft-tissue]
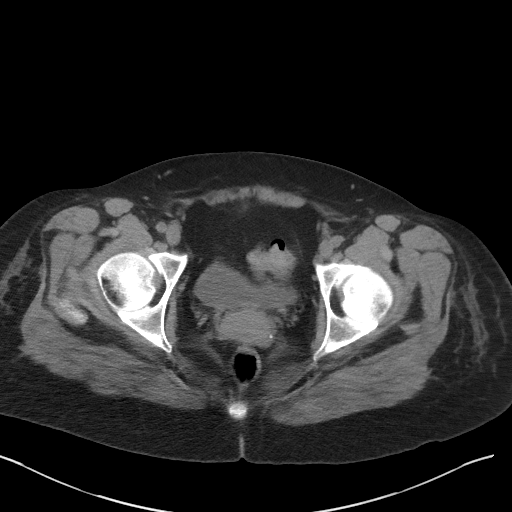
[im 27/59  soft-tissue]
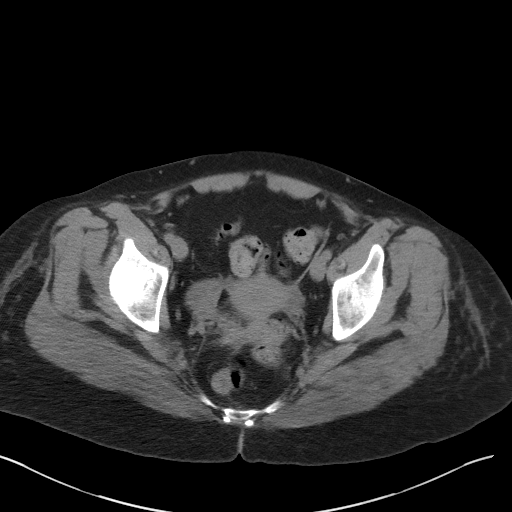
[im 32/59  soft-tissue]
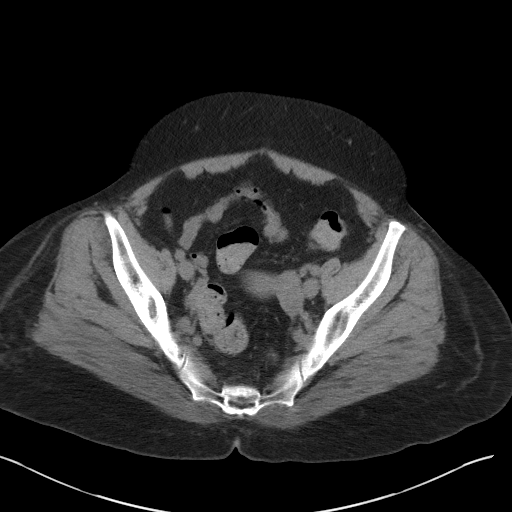
[im 36/59  soft-tissue]
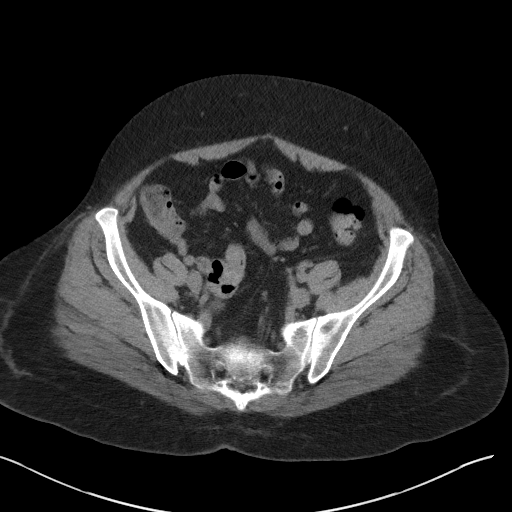
[im 36/59  bone]
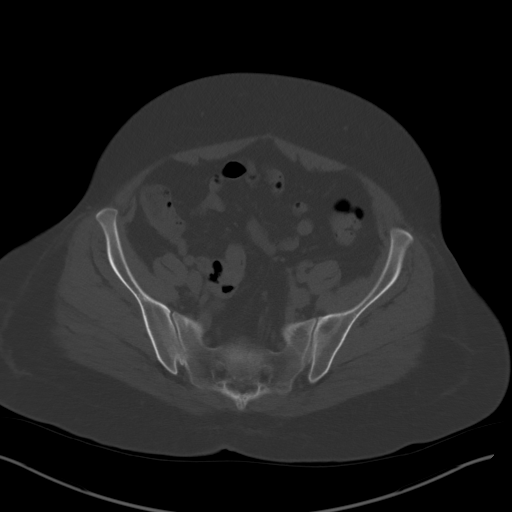
[im 40/59  soft-tissue]
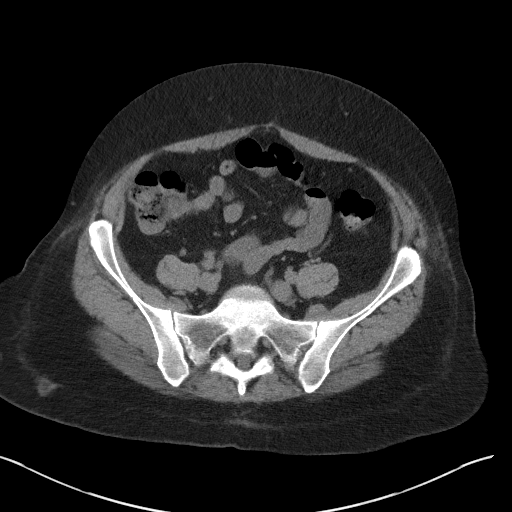
[im 44/59  soft-tissue]
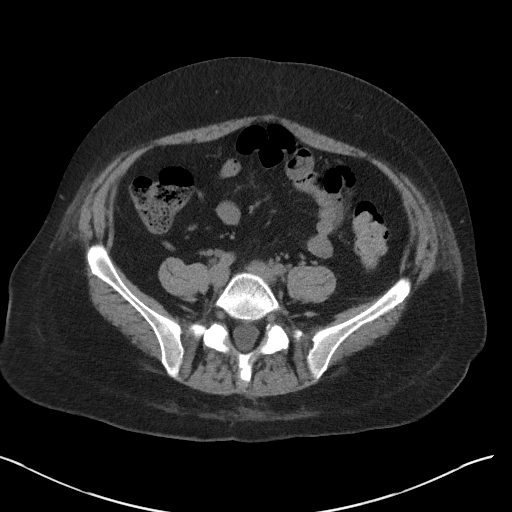
[im 47/59  soft-tissue]
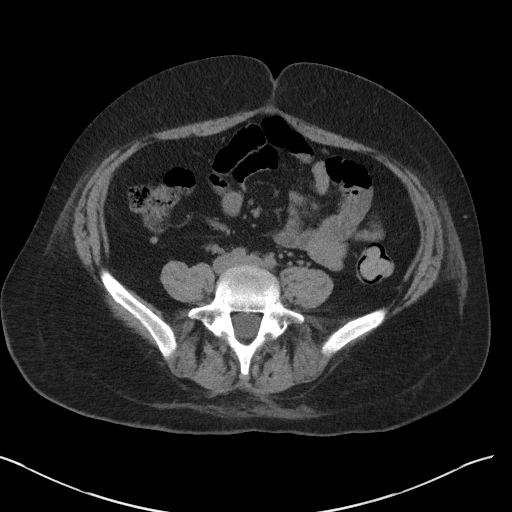
[im 51/59  soft-tissue]
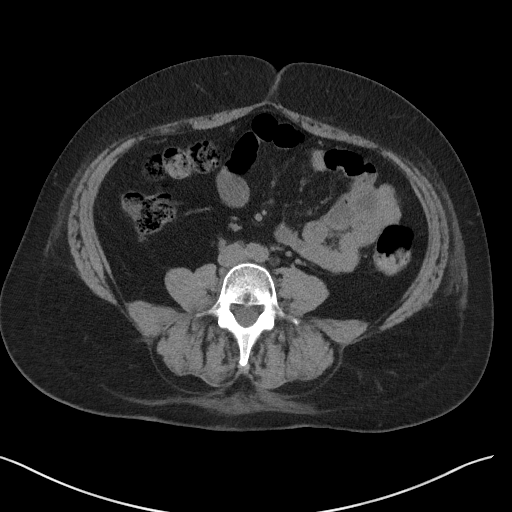
[im 55/59  soft-tissue]
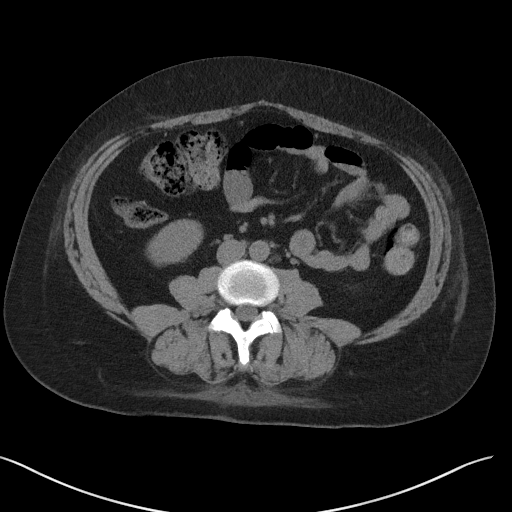

[Series 4: coronal st · coronal · 0.60mm/px · 3 of 91 slices shown]
[im 31/91  soft-tissue]
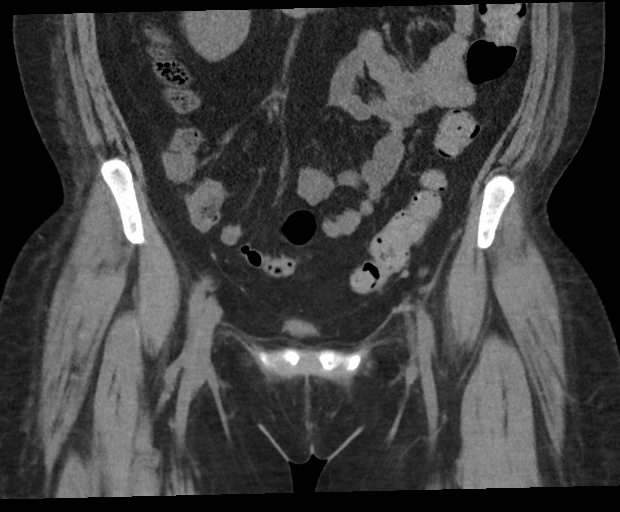
[im 41/91  soft-tissue]
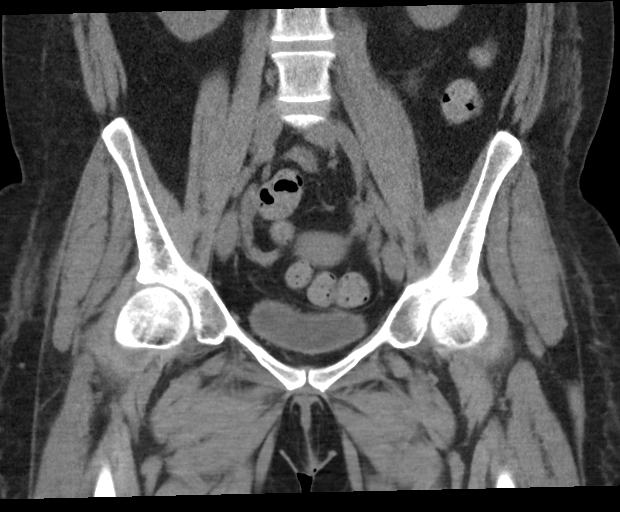
[im 51/91  soft-tissue]
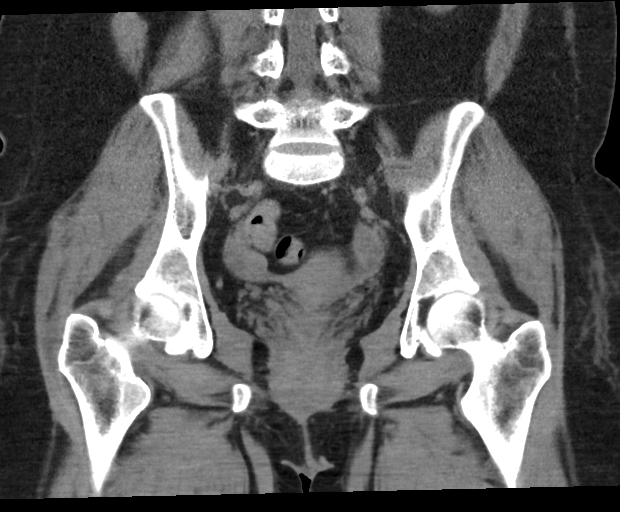

[17 of 46 positions shown; findings below may reference images not displayed]

FINDINGS: Urinary Tract: Bladder is partially distended. Visualized kidneys
appear within normal limits.

Bowel: Appendix is unremarkable. No specific bowel abnormality is
noted.

Vascular/Lymphatic: No pathologically enlarged lymph nodes. No
significant vascular abnormality seen.

Reproductive: Uterus appears within normal limits. No ovarian
lesions are seen.

Other:  No free fluid is noted.

Musculoskeletal: There is an undisplaced fracture at the base of the
right femoral neck without significant extension in the
intratrochanteric region. Acetabula are within normal limits. No
other fracture is seen.
IMPRESSION: Undisplaced fracture at the base of the right femoral neck without
significant extension into the intratrochanteric region.

No other focal abnormality is noted.

## 2021-02-01 IMAGING — CT CT L SPINE W/O CM
3 series · 12 of 33 positions shown, 14 images · non-contrast
Comparison: Lumbar spine radiograph dated 12/20/2018

CLINICAL DATA: 32-year-old female with low back pain radiating down
right hip.

EXAM:
CT LUMBAR SPINE WITHOUT CONTRAST
TECHNIQUE: Multidetector CT imaging of the lumbar spine was performed without
intravenous contrast administration. Multiplanar CT image
reconstructions were also generated.

[Series 4: l spine soft · axial · 0.35mm/px · z∈[+936,+1126]mm · 4 of 137 slices shown, 5 images]
[im 21/137  soft-tissue]
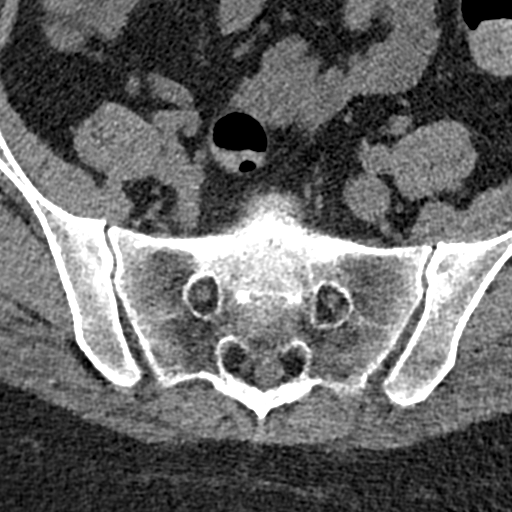
[im 21/137  bone]
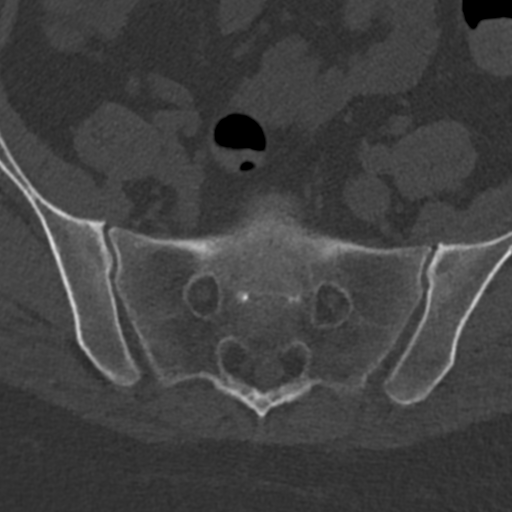
[im 53/137  bone]
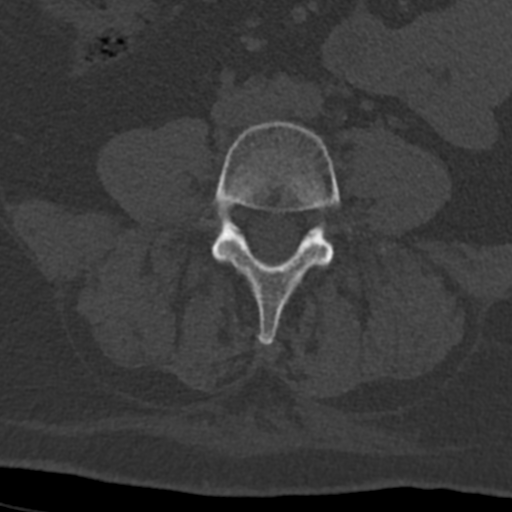
[im 84/137  bone]
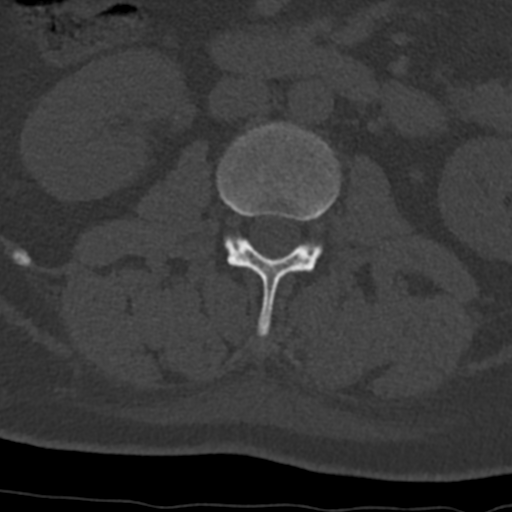
[im 116/137  bone]
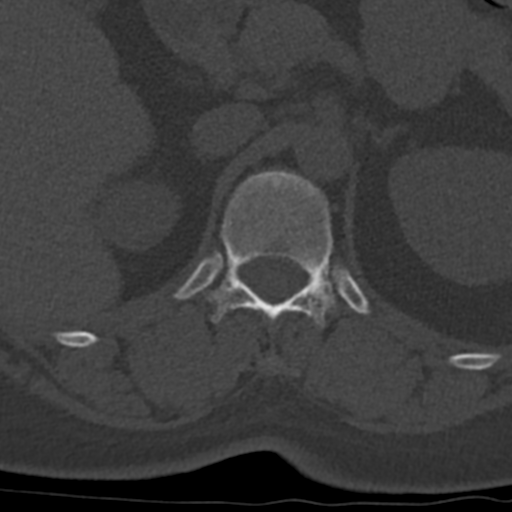

[Series 5: sagittal bone · sagittal · 0.34mm/px · 5 of 88 slices shown, 6 images]
[im 30/88  bone]
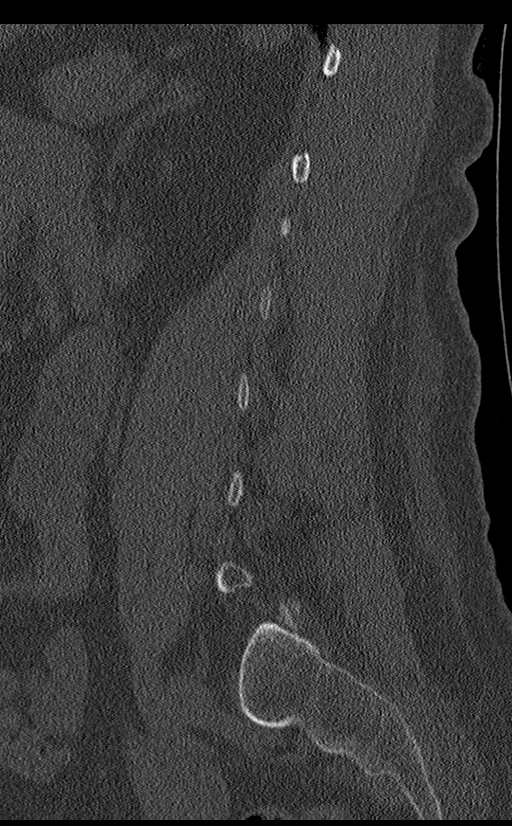
[im 37/88  bone]
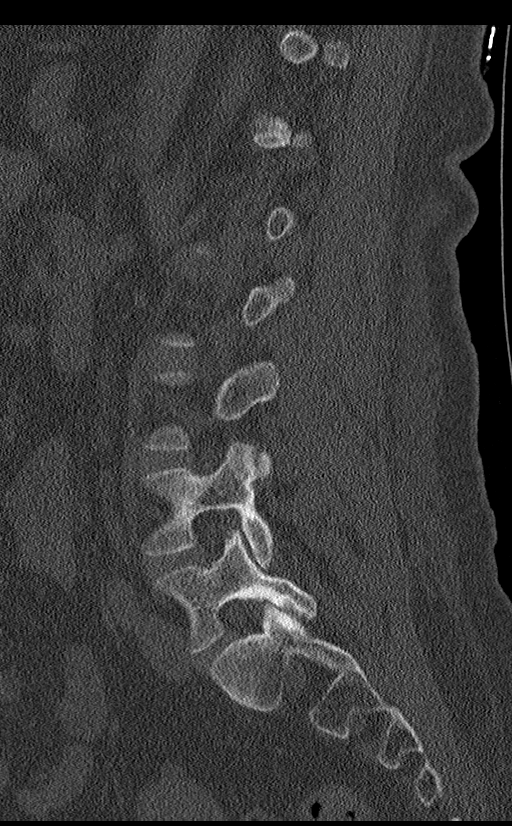
[im 44/88  soft-tissue]
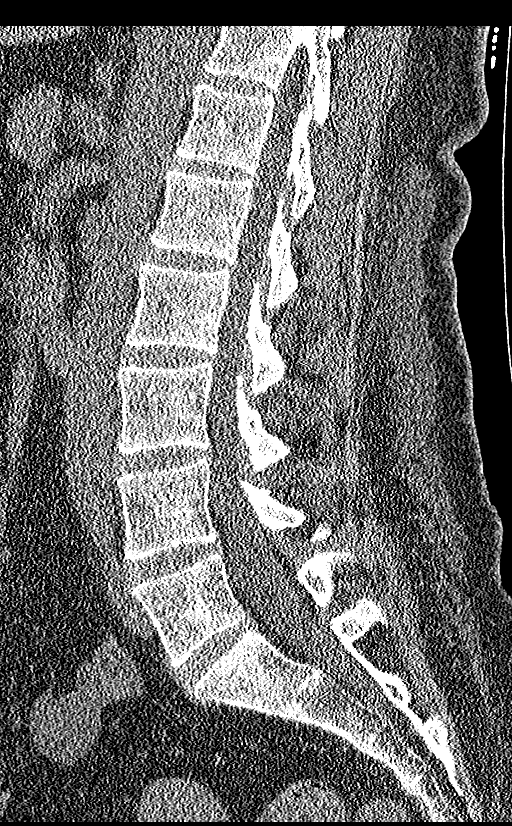
[im 44/88  bone]
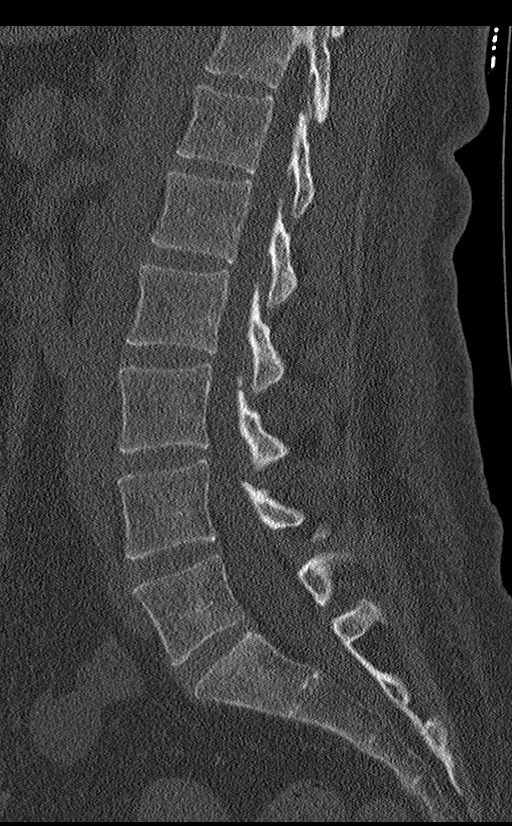
[im 51/88  bone]
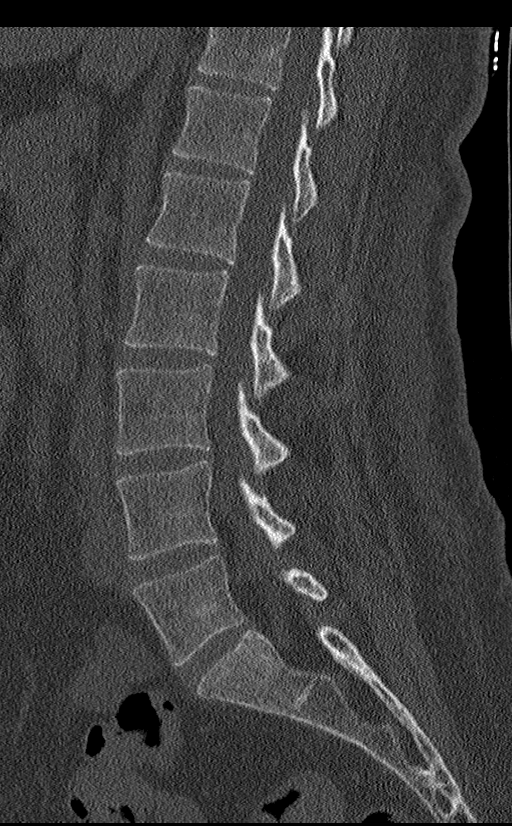
[im 59/88  bone]
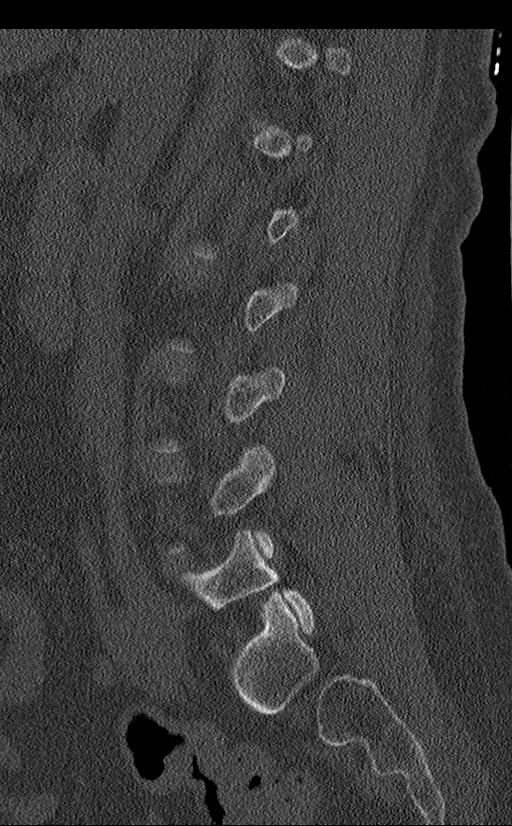

[Series 6: coronal bone · coronal · 0.40mm/px · 3 of 94 slices shown]
[im 19/94  bone]
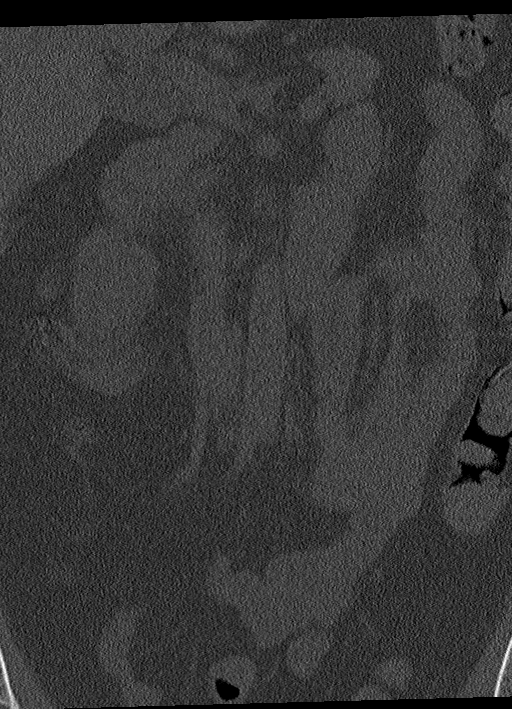
[im 38/94  bone]
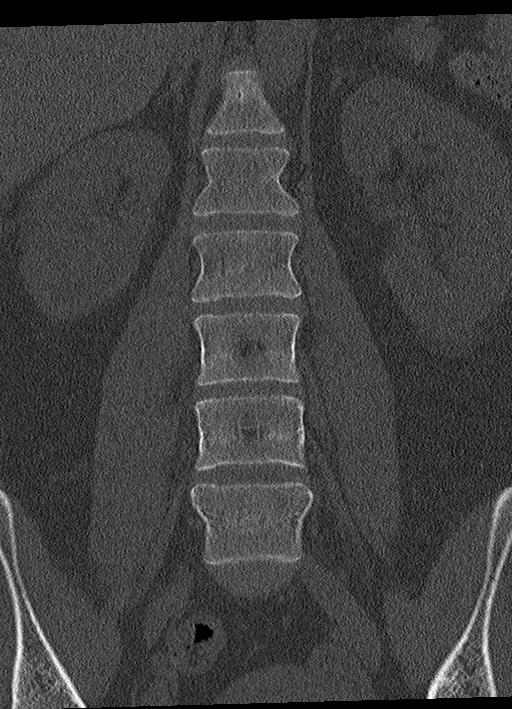
[im 56/94  bone]
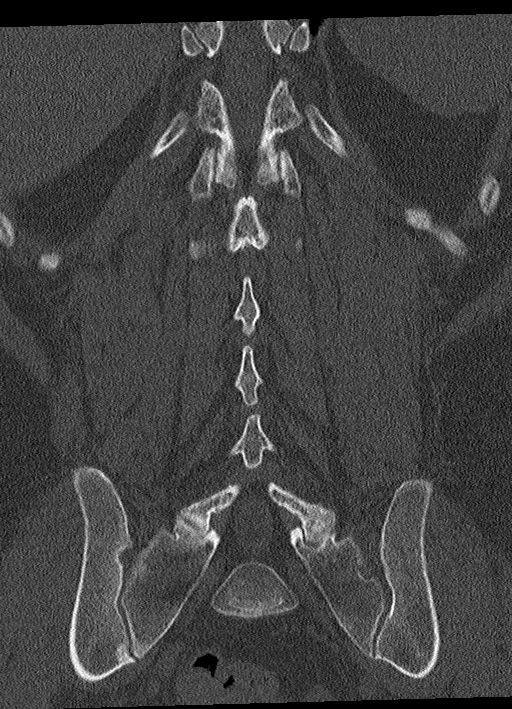

[12 of 33 positions shown; findings below may reference images not displayed]

FINDINGS: Segmentation: 5 lumbar type vertebrae.

Alignment: Normal.

Vertebrae: No acute fracture or focal pathologic process.

Paraspinal and other soft tissues: Negative.

Disc levels: Unremarkable. No degenerative changes. The neural
foramina are patent. Probable old healed left posterior twelfth rib
fracture.
IMPRESSION: No acute/traumatic lumbar spine pathology. No degenerative changes
or neural foramina stenosis.

## 2021-07-25 ENCOUNTER — Emergency Department (HOSPITAL_COMMUNITY)
Admission: EM | Admit: 2021-07-25 | Discharge: 2021-07-25 | Disposition: A | Discharge status: Home / Self Care | Payer: Medicaid Other | Attending: Emergency Medicine | Admitting: Emergency Medicine

## 2021-07-25 ENCOUNTER — Emergency Department (HOSPITAL_COMMUNITY): Payer: Medicaid Other

## 2021-07-25 ENCOUNTER — Encounter (HOSPITAL_COMMUNITY): Payer: Self-pay

## 2021-07-25 ENCOUNTER — Other Ambulatory Visit: Payer: Self-pay

## 2021-07-25 DIAGNOSIS — W01198A Fall on same level from slipping, tripping and stumbling with subsequent striking against other object, initial encounter: Secondary | ICD-10-CM | POA: Insufficient documentation

## 2021-07-25 DIAGNOSIS — L0201 Cutaneous abscess of face: Secondary | ICD-10-CM | POA: Diagnosis not present

## 2021-07-25 DIAGNOSIS — Y92000 Kitchen of unspecified non-institutional (private) residence as  the place of occurrence of the external cause: Secondary | ICD-10-CM | POA: Diagnosis not present

## 2021-07-25 DIAGNOSIS — L0291 Cutaneous abscess, unspecified: Secondary | ICD-10-CM

## 2021-07-25 DIAGNOSIS — L089 Local infection of the skin and subcutaneous tissue, unspecified: Secondary | ICD-10-CM | POA: Insufficient documentation

## 2021-07-25 DIAGNOSIS — D72829 Elevated white blood cell count, unspecified: Secondary | ICD-10-CM | POA: Diagnosis not present

## 2021-07-25 DIAGNOSIS — S0990XA Unspecified injury of head, initial encounter: Secondary | ICD-10-CM | POA: Diagnosis present

## 2021-07-25 DIAGNOSIS — Z23 Encounter for immunization: Secondary | ICD-10-CM | POA: Diagnosis not present

## 2021-07-25 DIAGNOSIS — R21 Rash and other nonspecific skin eruption: Secondary | ICD-10-CM | POA: Diagnosis not present

## 2021-07-25 DIAGNOSIS — H05222 Edema of left orbit: Secondary | ICD-10-CM | POA: Insufficient documentation

## 2021-07-25 LAB — CBC WITH DIFFERENTIAL/PLATELET
Abs Immature Granulocytes: 0.18 10*3/uL — ABNORMAL HIGH (ref 0.00–0.07)
Basophils Absolute: 0.1 10*3/uL (ref 0.0–0.1)
Basophils Relative: 1 %
Eosinophils Absolute: 0.7 10*3/uL — ABNORMAL HIGH (ref 0.0–0.5)
Eosinophils Relative: 4 %
HCT: 38.3 % (ref 36.0–46.0)
Hemoglobin: 12.3 g/dL (ref 12.0–15.0)
Immature Granulocytes: 1 %
Lymphocytes Relative: 25 %
Lymphs Abs: 4.1 10*3/uL — ABNORMAL HIGH (ref 0.7–4.0)
MCH: 26.2 pg (ref 26.0–34.0)
MCHC: 32.1 g/dL (ref 30.0–36.0)
MCV: 81.7 fL (ref 80.0–100.0)
Monocytes Absolute: 1.2 10*3/uL — ABNORMAL HIGH (ref 0.1–1.0)
Monocytes Relative: 7 %
Neutro Abs: 10.3 10*3/uL — ABNORMAL HIGH (ref 1.7–7.7)
Neutrophils Relative %: 62 %
Platelets: 390 10*3/uL (ref 150–400)
RBC: 4.69 MIL/uL (ref 3.87–5.11)
RDW: 14.8 % (ref 11.5–15.5)
WBC: 16.5 10*3/uL — ABNORMAL HIGH (ref 4.0–10.5)
nRBC: 0 % (ref 0.0–0.2)

## 2021-07-25 LAB — BASIC METABOLIC PANEL
Anion gap: 8 (ref 5–15)
BUN: 19 mg/dL (ref 6–20)
CO2: 23 mmol/L (ref 22–32)
Calcium: 9 mg/dL (ref 8.9–10.3)
Chloride: 103 mmol/L (ref 98–111)
Creatinine, Ser: 0.61 mg/dL (ref 0.44–1.00)
GFR, Estimated: >60 mL/min (ref >60)
Glucose, Bld: 117 mg/dL — ABNORMAL HIGH (ref 70–99)
Potassium: 3.8 mmol/L (ref 3.5–5.1)
Sodium: 134 mmol/L — ABNORMAL LOW (ref 135–145)

## 2021-07-25 LAB — LACTIC ACID, PLASMA: Lactic Acid, Venous: 1.1 mmol/L (ref 0.5–1.9)

## 2021-07-25 MED ORDER — LIDOCAINE-EPINEPHRINE 2 %-1:200000 IJ SOLN
10.0000 mL | Freq: Once | INTRAMUSCULAR | Status: AC
Start: 2021-07-25 — End: 2021-07-25
  Administered 2021-07-25: 10 mL
  Filled 2021-07-25: qty 20

## 2021-07-25 MED ORDER — ONDANSETRON 8 MG PO TBDP: 8.0000 mg | ORAL_TABLET | Freq: Three times a day (TID) | ORAL | 0 refills | Status: DC | PRN

## 2021-07-25 MED ORDER — DOXYCYCLINE HYCLATE 100 MG PO CAPS: 100.0000 mg | ORAL_CAPSULE | Freq: Two times a day (BID) | ORAL | 0 refills | Status: DC

## 2021-07-25 MED ORDER — VANCOMYCIN HCL IN DEXTROSE 1-5 GM/200ML-% IV SOLN
1000.0000 mg | Freq: Once | INTRAVENOUS | Status: AC
  Administered 2021-07-25: 1000 mg via INTRAVENOUS
  Filled 2021-07-25: qty 200

## 2021-07-25 MED ORDER — TETANUS-DIPHTH-ACELL PERTUSSIS 5-2.5-18.5 LF-MCG/0.5 IM SUSY
0.5000 mL | PREFILLED_SYRINGE | Freq: Once | INTRAMUSCULAR | Status: AC
  Administered 2021-07-25: 0.5 mL via INTRAMUSCULAR
  Filled 2021-07-25: qty 0.5

## 2021-07-25 NOTE — Discharge Instructions (Signed)
Take doxycycline twice daily for the next 10 days. ?Do warm compresses over the forehead. ?Follow-up with your primary care doctor next week for wound reevaluation. ?Return to the ED for new or worsening symptoms, facial swelling, vision changes. ?

## 2021-07-25 NOTE — ED Triage Notes (Signed)
Patient states she tripped over her son's toy and hit her forehead on the counter. Patient has edematous and red wound to forehead with bilateral eye and facial swelling. Denies fever or chills  ?

## 2021-07-25 NOTE — ED Provider Notes (Signed)
?[facility] ?Provider Note ? ? ?CSN: [id number] ?Arrival date & time: 07/25/21  1443 ? ?  ? ?History ? ?Chief Complaint  ?Patient presents with  ? Head Injury  ? Wound Infection  ? ? ?Mallory Mitchell is a 36 y.o. female. ? ? ?Head Injury ? ?Patient presents due to fall/facial swelling.  Patient noticed a pimple on her forehead 3 days ago, 2 days ago she tripped over a toy and hit her head on the kitchen counter.  She think she lost consciousness for maybe 2 minutes.  No blurry vision, no nausea or vomiting.  She states last night she noticed swelling to her left eye, painful with eye movement, not having any fevers or blurry vision or purulent discharge.  She does not wear contacts.  She also endorses wound to her forehead where the pimple once was, denies any drainage from that site but it is painful and increasing in size per patient.   ? ?Patient has a history of IV methamphetamine use. ? ?Home Medications ?Prior to Admission medications   ?Medication Sig Start Date End Date Taking? Authorizing Provider  ?doxycycline (VIBRAMYCIN) 100 MG capsule Take 1 capsule (100 mg total) by mouth 2 (two) times daily. 07/25/21  Yes Sherrill Raring, PA-C  ?ondansetron (ZOFRAN-ODT) 8 MG disintegrating tablet Take 1 tablet (8 mg total) by mouth every 8 (eight) hours as needed for nausea or vomiting. 07/25/21  Yes Sherrill Raring, PA-C  ?acetaminophen (TYLENOL) 325 MG tablet Take 2 tablets (650 mg total) by mouth every 6 (six) hours as needed for mild pain, fever or headache (or Fever >/= 101). 12/28/18   Roxan Hockey, MD  ?celecoxib (CELEBREX) 200 MG capsule Take 1 capsule (200 mg total) by mouth 2 (two) times daily with a meal. 12/28/18   Emokpae, Courage, MD  ?CELLCEPT 500 MG tablet Take 500 mg by mouth 2 (two) times daily. 03/26/19   [provider]  ?cloNIDine (CATAPRES) 0.1 MG tablet Take 0.1 mg by mouth 2 (two) times daily. 07/07/21   [provider]  ?fluticasone (FLONASE) 50 MCG/ACT nasal spray  Place 1 spray into both nostrils daily as needed. 03/09/21   [provider]  ?gabapentin (NEURONTIN) 300 MG capsule Take 300 mg by mouth 2 (two) times daily. 04/28/21   [provider]  ?gabapentin (NEURONTIN) 600 MG tablet Take 1 tablet (600 mg total) by mouth 3 (three) times daily. 05/03/19   Carole Civil, MD  ?hydrOXYzine (ATARAX/VISTARIL) 25 MG tablet Take 25 mg by mouth 3 (three) times daily as needed for anxiety.    [provider]  ?medroxyPROGESTERone Acetate 150 MG/ML SUSY Inject 150 mg into the muscle every 3 (three) months. 05/13/21   [provider]  ?methadone (DOLOPHINE) 10 MG/ML solution Take 100 mg by mouth every morning.     [provider]  ?methocarbamol (ROBAXIN) 500 MG tablet Take 1 tablet (500 mg total) by mouth 3 (three) times daily. 05/01/19   Carole Civil, MD  ?pantoprazole (PROTONIX) 40 MG tablet Take 1 tablet (40 mg total) by mouth daily. 12/28/18 12/28/19  Roxan Hockey, MD  ?predniSONE (DELTASONE) 10 MG tablet Take 10 mg by mouth daily. 07/07/21   [provider]  ?pyridostigmine (MESTINON) 60 MG tablet Take 30 mg by mouth 3 (three) times daily.    [provider]  ?VENTOLIN HFA 108 (90 Base) MCG/ACT inhaler Inhale 2 puffs into the lungs every 4 (four) hours as needed. 05/31/21   [provider]  ?   ? ?  Allergies    ?Patient has no known allergies.   ? ?Review of Systems   ?Review of Systems ? ?Physical Exam ?Updated Vital Signs ?BP 106/67   Pulse 80   Temp 98.6 ?F (37 ?C) (Oral)   Resp 18   Ht '5\' 3"'$  (1.6 m)   Wt 75 kg   SpO2 100%   BMI 29.29 kg/m?  ?Physical Exam ?Vitals and nursing note reviewed. Exam conducted with a chaperone present.  ?Constitutional:   ?   General: She is not in acute distress. ?   Appearance: Normal appearance.  ?HENT:  ?   Head: Normocephalic.  ?   Right Ear: Tympanic membrane normal.  ?   Left Ear: Tympanic membrane normal.  ?   Nose: No rhinorrhea.  ?Eyes:  ?   General: Gaze  aligned appropriately. No scleral icterus.    ?   Right eye: No discharge.     ?   Left eye: No discharge.  ?   Extraocular Movements: Extraocular movements intact.  ?   Right eye: Normal extraocular motion.  ?   Left eye: Abnormal extraocular motion present.  ?   Conjunctiva/sclera: Conjunctivae normal.  ?   Right eye: Right conjunctiva is not injected.  ?   Left eye: Left conjunctiva is not injected.  ?   Pupils: Pupils are equal, round, and reactive to light.  ?   Comments: Left eye pain with EOM but EOMI. periorbital edema  ?Cardiovascular:  ?   Rate and Rhythm: Normal rate and regular rhythm.  ?Pulmonary:  ?   Effort: Pulmonary effort is normal.  ?   Breath sounds: Normal breath sounds.  ?Skin: ?   Capillary Refill: Capillary refill takes less than 2 seconds.  ?   Coloration: Skin is not jaundiced.  ?   Findings: Erythema and rash present.  ?Neurological:  ?   Mental Status: She is alert. Mental status is at baseline.  ?   Coordination: Coordination normal.  ? ? ? ?ED Results / Procedures / Treatments   ?Labs ?(all labs ordered are listed, but only abnormal results are displayed) ?Labs Reviewed  ?CBC WITH DIFFERENTIAL/PLATELET - Abnormal; Notable for the following components:  ?    Result Value  ? WBC 16.5 (*)   ? Neutro Abs 10.3 (*)   ? Lymphs Abs 4.1 (*)   ? Monocytes Absolute 1.2 (*)   ? Eosinophils Absolute 0.7 (*)   ? Abs Immature Granulocytes 0.18 (*)   ? All other components within normal limits  ?BASIC METABOLIC PANEL - Abnormal; Notable for the following components:  ? Sodium 134 (*)   ? Glucose, Bld 117 (*)   ? All other components within normal limits  ?CULTURE, BLOOD (ROUTINE X 2)  ?CULTURE, BLOOD (ROUTINE X 2)  ?AEROBIC CULTURE W GRAM STAIN (SUPERFICIAL SPECIMEN)  ?LACTIC ACID, PLASMA  ?PREGNANCY, URINE  ? ? ?EKG ?None ? ?Radiology ?CT Head Wo Contrast ? ?Result Date: 07/25/2021 ?CLINICAL DATA:  Head trauma. Tripped over signs toe 8 and hit forehead on counter. EXAM: CT HEAD WITHOUT CONTRAST CT  MAXILLOFACIAL WITHOUT CONTRAST CT CERVICAL SPINE WITHOUT CONTRAST TECHNIQUE: Multidetector CT imaging of the head, cervical spine, and maxillofacial structures were performed using the standard protocol without intravenous contrast. Multiplanar CT image reconstructions of the cervical spine and maxillofacial structures were also generated. RADIATION DOSE REDUCTION: This exam was performed according to the departmental dose-optimization program which includes automated exposure control, adjustment of the mA and/or kV according to patient  size and/or use of iterative reconstruction technique. COMPARISON:  02/12/2013 FINDINGS: CT HEAD FINDINGS Brain: No evidence of acute infarction, hemorrhage, hydrocephalus, extra-axial collection or mass lesion/mass effect. Vascular: No hyperdense vessel or unexpected calcification. Skull: Normal. Negative for fracture or focal lesion. Other: Midline frontal scalp hematoma measures 1 cm in thickness, image 19/2. CT MAXILLOFACIAL FINDINGS Osseous: No fracture or mandibular dislocation. No destructive process. Orbits: Negative. No traumatic or inflammatory finding. Sinuses: There is moderate mucosal thickening involving the right maxillary sinus. Soft tissues: Left preseptal and left infraorbital hematomas identified, image 30/3 and image 42/3. CT CERVICAL SPINE FINDINGS Alignment: Normal. Skull base and vertebrae: No acute fracture. No primary bone lesion or focal pathologic process. Soft tissues and spinal canal: No prevertebral fluid or swelling. No visible canal hematoma. Disc levels: No significant degenerative change. The disc spaces are well preserved. Upper chest: Negative. Other: None IMPRESSION: 1. No acute intracranial abnormality. 2. Midline frontal scalp hematoma. 3. Left preseptal and left infraorbital hematomas. 4. No evidence for facial bone fracture. 5. No evidence for cervical spine fracture or subluxation. Electronically Signed   By: Kerby Moors M.D.   On:  07/25/2021 11:16  ? ?CT Cervical Spine Wo Contrast ? ?Result Date: 07/25/2021 ?CLINICAL DATA:  Head trauma. Tripped over signs toe 8 and hit forehead on counter. EXAM: CT HEAD WITHOUT CONTRAST CT MAXILLOFACIAL WITHOUT CON

## 2021-07-27 LAB — AEROBIC CULTURE W GRAM STAIN (SUPERFICIAL SPECIMEN): Special Requests: NORMAL

## 2021-07-28 ENCOUNTER — Telehealth: Payer: Self-pay | Admitting: *Deleted

## 2021-07-28 NOTE — Telephone Encounter (Signed)
Post ED Visit - Positive Culture Follow-up ? ?Culture report reviewed by antimicrobial stewardship pharmacist: ?[place] Team ?'[]'$  Elenor Quinones, Pharm.D. ?'[]'$  Heide Guile, Pharm.D., BCPS AQ-ID ?'[]'$  Parks Neptune, Pharm.D., BCPS ?'[]'$  Alycia Rossetti, Pharm.D., BCPS ?'[]'$  [place], Pharm.D., BCPS, AAHIVP ?'[]'$  Legrand Como, Pharm.D., BCPS, AAHIVP ?'[]'$  Salome Arnt, PharmD, BCPS ?'[]'$  Johnnette Gourd, PharmD, BCPS ?'[]'$  Hughes Better, PharmD, BCPS ?'[]'$  Leeroy Cha, PharmD ?'[]'$  Laqueta Linden, PharmD, BCPS ?'[]'$  Albertina Parr, PharmD ? ?[place] Team ?'[]'$  Leodis Sias, PharmD ?'[]'$  Lindell Spar, PharmD ?'[]'$  Royetta Asal, PharmD ?'[]'$  Graylin Shiver, Rph ?'[]'$  Rema Fendt) Glennon Mac, PharmD ?'[]'$  Arlyn Dunning, PharmD ?'[]'$  Netta Cedars, PharmD ?'[]'$  Dia Sitter, PharmD ?'[]'$  Leone Haven, PharmD ?'[]'$  Gretta Arab, PharmD ?'[]'$  Theodis Shove, PharmD ?'[]'$  Peggyann Juba, PharmD ?'[]'$  Reuel Boom, PharmD ? ? ?Positive urine culture ?Treated with Doxycycline Hyclate, organism sensitive to the same and no further patient follow-up is required at this time.  Juluis Pitch, PharmD ? ?Ardeen Fillers ?07/28/2021, 11:07 AM ?  ?

## 2021-07-30 LAB — CULTURE, BLOOD (ROUTINE X 2)
Culture: NO GROWTH
Culture: NO GROWTH

## 2022-09-19 ENCOUNTER — Ambulatory Visit: Payer: Medicaid Other | Admitting: Neurology

## 2022-09-19 ENCOUNTER — Encounter: Payer: Self-pay | Admitting: Neurology

## 2022-10-18 ENCOUNTER — Ambulatory Visit: Payer: Medicaid Other | Admitting: Neurology

## 2022-12-19 ENCOUNTER — Ambulatory Visit: Payer: MEDICAID | Admitting: Neurology

## 2022-12-19 ENCOUNTER — Encounter: Payer: Self-pay | Admitting: Neurology

## 2022-12-19 VITALS — BP 118/78 | Ht 63.0 in | Wt 141.0 lb

## 2022-12-19 DIAGNOSIS — R1310 Dysphagia, unspecified: Secondary | ICD-10-CM

## 2022-12-19 DIAGNOSIS — G7001 Myasthenia gravis with (acute) exacerbation: Secondary | ICD-10-CM | POA: Diagnosis not present

## 2022-12-19 DIAGNOSIS — G7000 Myasthenia gravis without (acute) exacerbation: Secondary | ICD-10-CM | POA: Insufficient documentation

## 2022-12-19 MED ORDER — MYCOPHENOLATE MOFETIL 500 MG PO TABS: 1000.0000 mg | ORAL_TABLET | Freq: Two times a day (BID) | ORAL | 11 refills | Status: AC

## 2022-12-19 MED ORDER — PYRIDOSTIGMINE BROMIDE 60 MG PO TABS: 60.0000 mg | ORAL_TABLET | Freq: Four times a day (QID) | ORAL | 11 refills | Status: AC

## 2022-12-19 MED ORDER — PREDNISONE 10 MG PO TABS: 60.0000 mg | ORAL_TABLET | Freq: Every day | ORAL | 3 refills | Status: DC

## 2022-12-19 NOTE — Progress Notes (Signed)
Chief Complaint  Patient presents with   New Patient (Initial Visit)    Rm 15, NP MG, with aunt Tammie, has speech impediment but hears and understands      ASSESSMENT AND PLAN  Mallory Mitchell is a 37 y.o. female   Myasthenia gravis, with exacerbation  She has been out of treatment for over 1 year, profound bulbar weakness, moderate neck flexion weakness, mild to moderate limb muscle weakness, significant dysphagia, dysarthria,  I have suggest hospital admission for treatment, she has 2 young children to take care of at home,  IV Solu-Medrol 1 g for 3 consecutive days  Start CellCept 500 mg 2 tablets twice a day  Mestinon 60 mg 3-4 times a day  Laboratory evaluation today  Repetitive nerve conduction study  DIAGNOSTIC DATA (LABS, IMAGING, TESTING) - I reviewed patient records, labs, notes, testing and imaging myself where available.   MEDICAL HISTORY:  Mallory Mitchell, is a 36 year old female seen in request by   her primary care PA [facility] [place] for evaluation of weakness, she is accompanied by her aunt at today's visit December 19, 2022  I reviewed and summarized the referring note. PMHX.  She was previously under the care of of local neurologist Dr. Judithann Sheen, which has left the practice for more than 1 year.  She was given the diagnosis of mild tenia gravis, but I did not find serology test in the system, in December 2018, about 6 to 7 months post the pattern of her second child, she has developed dysarthria, dysphagia, double vision, droopy eyelid, trouble walking, was evaluated by Dr. Gerilyn Pilgrim, was given the diagnosis of myasthenia gravis, treated with IVIG once, complains of explosive headaches," as if I am going to die", also Mestinon 30 mg 3 times a day, prednisone, CellCept, which has helped her symptoms,  Even at her best, with all above treatment, she still has difficulty talking, swallowing, but not to such degree, since 2023, she has been out of all her  medications, symptoms gradually getting worse, now she has such difficulty speaking, swallowing, sometimes shortness of breath, neck job, limb muscle weakness, double vision, difficult to function in the day to day life, but she has 2 young children, she is the sole caregiver, denies hospital admission  Lab in 2024, negative pregnancy test, normal CBC, BMP showed low sodium 134  Urine drug screen was positive for amphetamine, benzodiazepine, marijuana, methadone in November 2022  PHYSICAL EXAM:   Vitals:   12/19/22 1325  BP: 118/78  Weight: 141 lb (64 kg)  Height: 5\' 3"  (1.6 m)    Body mass index is 24.98 kg/m.  PHYSICAL EXAMNIATION:  Gen: NAD, conversant, well nourised, well groomed                     Cardiovascular: Regular rate rhythm, no peripheral edema, warm, nontender. Eyes: Conjunctivae clear without exudates or hemorrhage Neck: Supple, no carotid bruits. Pulmonary: Poor air movement at the base of her lung  NEUROLOGICAL EXAM:  MENTAL STATUS: Speech/cognition: Awake, alert, oriented to history taking and casual conversation, but severe dysarthria, CRANIAL NERVES: CN II: Visual fields are full to confrontation. Pupils are round equal and briskly reactive to light. CN III, IV, VI: extraocular movement are normal.  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  CN IX, X: Phonation is normal. CN XI: Head turning and shoulder shrug are intact  MOTOR: Moderate neck flexion weakness, mild to moderate bilateral proximal upper and lower extremity weakness,  REFLEXES: Reflexes are 2+ and symmetric at the biceps, triceps, knees, and ankles. Plantar responses are flexor.  SENSORY: Intact to light touch, pinprick and vibratory sensation are intact in fingers and  toes.  COORDINATION: There is no trunk or limb dysmetria noted.  GAIT/STANCE: Push-up from seated position, steady gait  REVIEW OF SYSTEMS:  Full 14 system review of systems performed and notable only for as above All other review of systems were negative.   ALLERGIES: No Known Allergies  HOME MEDICATIONS: Current Outpatient Medications  Medication Sig Dispense Refill   acetaminophen (TYLENOL) 325 MG tablet Take 2 tablets (650 mg total) by mouth every 6 (six) hours as needed for mild pain, fever or headache (or Fever >/= 101). 30 tablet 1   gabapentin (NEURONTIN) 300 MG capsule Take 300 mg by mouth 2 (two) times daily.     methadone (DOLOPHINE) 10 MG/ML solution Take 100 mg by mouth every morning.      pyridostigmine (MESTINON) 60 MG tablet Take 30 mg by mouth 3 (three) times daily.     VENTOLIN HFA 108 (90 Base) MCG/ACT inhaler Inhale 2 puffs into the lungs every 4 (four) hours as needed.     celecoxib (CELEBREX) 200 MG capsule Take 1 capsule (200 mg total) by mouth 2 (two) times daily with a meal. (Patient not taking: Reported on 12/19/2022) 60 capsule 1   CELLCEPT 500 MG tablet Take 500 mg by mouth 2 (two) times daily. (Patient not taking: Reported on 12/19/2022)     cloNIDine (CATAPRES) 0.1 MG tablet Take 0.1 mg by mouth 2 (two) times daily. (Patient not taking: Reported on 12/19/2022)     doxycycline (VIBRAMYCIN) 100 MG capsule Take 1 capsule (100 mg total) by mouth 2 (two) times daily. (Patient not taking: Reported on 12/19/2022) 20 capsule 0   fluticasone (FLONASE) 50 MCG/ACT nasal spray Place 1 spray into both nostrils daily as needed. (Patient not taking: Reported on 12/19/2022)     gabapentin (NEURONTIN) 600 MG tablet Take 1 tablet (600 mg total) by mouth 3 (three) times daily. (Patient not taking: Reported on 12/19/2022) 20 tablet 0   hydrOXYzine (ATARAX/VISTARIL) 25 MG tablet Take 25 mg by mouth 3 (three) times daily as needed for anxiety. (Patient not taking: Reported on  12/19/2022)     medroxyPROGESTERone Acetate 150 MG/ML SUSY Inject 150 mg into the muscle every 3 (three) months. (Patient not taking: Reported on 12/19/2022)     methocarbamol (ROBAXIN) 500 MG tablet Take 1 tablet (500 mg total) by mouth 3 (three) times daily. (Patient not taking: Reported on 12/19/2022) 60 tablet 1   ondansetron (ZOFRAN-ODT) 8 MG disintegrating tablet Take 1 tablet (8 mg total) by mouth every 8 (eight) hours as needed for nausea or vomiting. (Patient not taking: Reported on 12/19/2022) 20 tablet 0   pantoprazole (PROTONIX) 40 MG tablet Take 1 tablet (40 mg total) by mouth daily. 30 tablet 1   predniSONE (DELTASONE) 10 MG tablet Take 10 mg by mouth daily. (Patient not taking: Reported on 12/19/2022)     No current facility-administered medications for this visit.    PAST MEDICAL HISTORY: Past Medical History:  Diagnosis Date   Depression    Myasthenia gravis (HCC)    Seizures (HCC)     PAST SURGICAL HISTORY: Past Surgical History:  Procedure Laterality  Date   arm surgery Left    rod in left arm   COMPRESSION HIP SCREW Right 12/27/2018   Procedure: OPEN REDUCTION INTERNAL FIXATION RIGHT HIP;  Surgeon: Vickki Hearing, MD;  Location: [facility];  Service: Orthopedics;  Laterality: Right;   LEG SURGERY Bilateral    rods in legs   MOUTH SURGERY      FAMILY HISTORY: Family History  Problem Relation Age of Onset   Liver cancer Mother     SOCIAL HISTORY: Social History   Socioeconomic History   Marital status: Single    Spouse name: Not on file   Number of children: Not on file   Years of education: Not on file   Highest education level: Not on file  Occupational History   Not on file  Tobacco Use   Smoking status: Some Days    Current packs/day: 0.50    Types: Cigarettes   Smokeless tobacco: Never  Vaping Use   Vaping status: Never Used  Substance and Sexual Activity   Alcohol use: Yes    Comment: occ   Drug use: Not Currently    Types: Cocaine,  Marijuana    Comment: methadone   Sexual activity: Yes    Birth control/protection: Abstinence  Other Topics Concern   Not on file  Social History Narrative   Right handed   Lives with son   Caffeine-2-3 cups daily   Social Determinants of Health   Financial Resource Strain: Not on file  Food Insecurity: Not on file  Transportation Needs: No Transportation Needs (12/14/2022)   Received from [facility]   PRAPARE - Transportation    Lack of Transportation (Medical): No    Lack of Transportation (Non-Medical): No  Physical Activity: Not on file  Stress: Not on file  Social Connections: Not on file  Intimate Partner Violence: Not At Risk (12/14/2022)   Received from [facility]   Humiliation, Afraid, Rape, and Kick questionnaire    Fear of Current or Ex-Partner: No    Emotionally Abused: No    Physically Abused: No    Sexually Abused: No      Levert Feinstein, M.D. Ph.D.  [facility] [address] [place], [place] [address] Ph: [phone] Fax: [phone]  CC:  Reather Converse, PA-C [address] [place],  [place] [address]  Reather Converse, PA-C

## 2022-12-21 ENCOUNTER — Telehealth: Payer: Self-pay | Admitting: Neurology

## 2022-12-21 LAB — COMPREHENSIVE METABOLIC PANEL
ALT: 14 IU/L (ref 0–32)
AST: 26 IU/L (ref 0–40)
Albumin: 4.3 g/dL (ref 3.9–4.9)
Alkaline Phosphatase: 90 IU/L (ref 44–121)
BUN/Creatinine Ratio: 27 — ABNORMAL HIGH (ref 9–23)
BUN: 16 mg/dL (ref 6–20)
CO2: 20 mmol/L (ref 20–29)
Calcium: 9.5 mg/dL (ref 8.7–10.2)
Chloride: 98 mmol/L (ref 96–106)
Creatinine, Ser: 0.59 mg/dL (ref 0.57–1.00)
Globulin, Total: 3 g/dL (ref 1.5–4.5)
Glucose: 74 mg/dL (ref 70–99)
Potassium: 4.5 mmol/L (ref 3.5–5.2)
Sodium: 135 mmol/L (ref 134–144)
Total Protein: 7.3 g/dL (ref 6.0–8.5)
eGFR: 120 mL/min/{1.73_m2} (ref >59)

## 2022-12-21 LAB — MYASTHENIA GRAVIS PROFILE
AChR Binding Ab, Serum: 0.5 nmol/L — ABNORMAL HIGH (ref 0.00–0.24)
Acetylchol Block Ab: 38 % — ABNORMAL HIGH (ref 0–25)
Anti-striation Abs: NEGATIVE

## 2022-12-21 LAB — SEDIMENTATION RATE: Sed Rate: 53 mm/hr — ABNORMAL HIGH (ref 0–32)

## 2022-12-21 LAB — ANA W/REFLEX IF POSITIVE: Anti Nuclear Antibody (ANA): NEGATIVE

## 2022-12-21 LAB — SPECIMEN STATUS REPORT

## 2022-12-21 LAB — CK: Total CK: 39 U/L (ref 32–182)

## 2022-12-21 LAB — C-REACTIVE PROTEIN: CRP: 5 mg/L (ref 0–10)

## 2022-12-21 LAB — RPR: RPR Ser Ql: NONREACTIVE

## 2022-12-21 LAB — VITAMIN B12: Vitamin B-12: 705 pg/mL (ref 232–1245)

## 2022-12-21 LAB — HIV ANTIBODY (ROUTINE TESTING W REFLEX): HIV Screen 4th Generation wRfx: NONREACTIVE

## 2022-12-21 LAB — TSH: TSH: 4.13 u[IU]/mL (ref 0.450–4.500)

## 2022-12-21 LAB — HGB A1C W/O EAG: Hgb A1c MFr Bld: 5.7 % — ABNORMAL HIGH (ref 4.8–5.6)

## 2022-12-21 MED ORDER — ALPRAZOLAM 0.5 MG PO TABS
0.5000 mg | ORAL_TABLET | Freq: Every evening | ORAL | 0 refills | Status: AC | PRN
Start: 2022-12-21 — End: ?

## 2022-12-21 NOTE — Telephone Encounter (Signed)
Laurena Bering [facility]: [id number] exp. 12/21/22-01/20/23 sent to [facility] [phone]

## 2022-12-27 ENCOUNTER — Ambulatory Visit (HOSPITAL_COMMUNITY): Payer: MEDICAID

## 2022-12-27 ENCOUNTER — Telehealth: Payer: Self-pay

## 2022-12-27 DIAGNOSIS — G7000 Myasthenia gravis without (acute) exacerbation: Secondary | ICD-10-CM

## 2022-12-27 NOTE — Telephone Encounter (Signed)
[facility] benadryl and tylenol infusion

## 2022-12-28 ENCOUNTER — Telehealth: Payer: Self-pay

## 2022-12-28 ENCOUNTER — Encounter: Payer: Self-pay | Admitting: Gastroenterology

## 2022-12-28 ENCOUNTER — Ambulatory Visit: Payer: MEDICAID | Admitting: Neurology

## 2022-12-28 NOTE — Procedures (Signed)
Full Name: Mallory Mitchell Gender: Female MRN #: [id number] Date of Birth: [birth year]    Visit Date: 12/19/2022 15:45 Age: 37 Years Examining Physician: Dr. Levert Feinstein Referring Physician: Dr. Levert Feinstein Height: 5 feet 3 inch History: 37 year old female with history of moderate to severe bulbar, limb muscle weakness, was given the diagnosis of myasthenia gravis, but I do not have serology report in the system  Summary of the tests:  Nerve conduction study:  Right median, ulnar sensory and motor responses were normal.  Right spinal accessory nerve stimulation: There nerve is stimulated posterior to the sternocleidomastoid muscle, with recording electrode over the upper trapezius. Routine motor nerve conduction study was performed, generated robust CMAP amplitude 3 Hz RNS at rest, there was 5.9% decrement between the first and fourth response. Maximum right shoulder elevation for 1 minute Followed by 3 Hz RNS immediately, 30 seconds, 1 minute, 2 minutes, 3 minutes, 4 minutes, 5 minutes, there was more than 10% decrement post exercise exhaustion, U-shape decrement was noted.  Right ulnar stimulation: The nerve was stimulated at right wrist, recording right abductor digital minimi. Routine motor nerve conduction study generated robust CMAP amplitude, With supramaximal stimulation, 3 Hz RNS generated 3% decrement between the first and fourth response Maximum right finger abduction for 1 minute Followed by 3 Hz RNS immediately, 30 seconds, 1 minute, 2 minutes, 3 minutes, 4 minutes, 5 minutes, there was mild post exercise exhaustion noted, maximum at post exercise 4, 5 minutes, with 7.7%, and 6% decrement respectively,  Conclusion: This is an abnormal study.  There is electrodiagnostic evidence of neuromuscular junctional disorder, with U-shape decrement following 3 Hz repetitive nerve stimulation, also demonstrated post exercise exhaustion, most noticeable at right trapezius muscle.  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 PhD  [facility] [address], [address] [place], [place] [address] Tel: [phone] Fax: [phone]  Verbal informed consent was obtained from the patient, patient was informed of potential risk of procedure, including bruising, bleeding, hematoma formation, infection, muscle weakness, muscle pain, numbness, among others.        MNC    Nerve / Sites Muscle Latency Ref. Amplitude Ref. Rel Amp Segments Distance Velocity Ref. Area    ms ms mV mV %  cm m/s m/s mVms  R Median - APB     Wrist APB 3.1 <=4.4 9.4 >=4.0 100 Wrist - APB 7   35.1     Upper arm APB 7.4  9.4  99.7 Upper arm - Wrist 23.6 55 >=49 34.3  R Ulnar - ADM     Wrist ADM 3.0 <=3.3 12.0 >=6.0 100 Wrist - ADM 7   34.9     B.Elbow ADM 5.4  11.1  93.1 B.Elbow - Wrist 14 59 >=49 32.3     A.Elbow ADM 7.8  11.1  99.9 A.Elbow - B.Elbow 12 50 >=49 32.5         SNC    Nerve / Sites Rec. Site Peak Lat Ref.  Amp Ref. Segments Distance    ms ms V V  cm  R Median - Orthodromic (Dig II, Mid palm)     Dig II Wrist 3.1 <=3.4 25 >=10 Dig II - Wrist 13  R Ulnar - Orthodromic, (Dig V, Mid palm)     Dig V Wrist 2.6 <=3.1 16 >=5 Dig  V - Wrist 11         F  Wave    Nerve F Lat Ref.   ms ms  R Ulnar - ADM 26.7 <=32.0             Rep Stim    Anatomy / Train Rate Ampl. Ampl 4-1 Fac Ampl Area Area 4-1 Fac Area   Hz mV % % mVms % %  R Abductor digiti minimi (manus) - (Ulnar)  Baseline @1Hz  1 11.9 -1.7 100 26.9 -5.3 100  Baseline @3Hz  3 11.4 -3 95.1 25.9 -7.6 96.2  Post Exercise @0 :00 3 11.2 -2.8 93.6 26.5 -3.7 98.5  @ 0:30 3 11.0 -0.4 92.5 27.8 -9.8 104  @ 1:00 3 11.6 -6.2 97.2 28.7 -11.9 107  @ 2:00 3 11.4 -6 95.2 27.4 -11.2 102  @ 3:00 3 11.0 -3.9 92.1 26.0 -12.5 96.9  @  4:00 3 11.1 -7.7 93.2 25.1 -16.2 93.6  @ 5:00 3 11.0 -6 92.2 25.5 -13.3 94.8  R Trapezius (upper) - (Accessory spinal)  Baseline @1Hz  1 9.6 -1.5 100 80.7 -6.1 100  Baseline @3Hz  3 9.6 -5.9 99.8 80.8 -16 100  Post Exercise @0 :00 3 9.8 -8 103 67.2 -16.7 83.3  @ 0:30 3 10.1 -10.7 106 69.2 -17.6 85.9  @ 1:00 3 9.9 -9.9 103 70.4 -23.6 87.3  @ 2:00 3 10.0 -17.5 105 68.4 -24 84.9  @ 3:00 3 9.9 -14.5 103 71.7 -25 88.9  @ 4:00 3 10.0 -16 105 69.0 -21.5 85.5  @ 5:00 3 10.0 -12.3 105 69.5 -18.9 86.2

## 2022-12-28 NOTE — Telephone Encounter (Signed)
Notified that patient had to cancel appointment today. Called number given by phone room and reached Horton Chin, her sons father. He states she could be reached at [phone]. Unable to contact her. Call made back to [facility] and he gave number [phone]. Spoke with Jamarie, she states she can't come today or tomorrow but can come Monday. Appointment made and patient understands is being worked in.

## 2022-12-28 NOTE — Telephone Encounter (Signed)
We need trough level lab for iv ig

## 2022-12-29 NOTE — Telephone Encounter (Signed)
IgM, IgA, & IgG

## 2023-01-02 ENCOUNTER — Ambulatory Visit (INDEPENDENT_AMBULATORY_CARE_PROVIDER_SITE_OTHER): Payer: MEDICAID | Admitting: Neurology

## 2023-01-02 ENCOUNTER — Encounter: Payer: Self-pay | Admitting: Neurology

## 2023-01-02 ENCOUNTER — Telehealth: Payer: Self-pay | Admitting: Neurology

## 2023-01-02 VITALS — BP 132/82 | HR 88 | Ht 63.0 in | Wt 150.5 lb

## 2023-01-02 DIAGNOSIS — L663 Perifolliculitis capitis abscedens: Secondary | ICD-10-CM

## 2023-01-02 DIAGNOSIS — G7000 Myasthenia gravis without (acute) exacerbation: Secondary | ICD-10-CM | POA: Diagnosis not present

## 2023-01-02 MED ORDER — MUPIROCIN-LIDOCAINE 2-2 % EX OINT: TOPICAL_OINTMENT | CUTANEOUS | 11 refills | Status: AC

## 2023-01-02 MED ORDER — GABAPENTIN 300 MG PO CAPS: 300.0000 mg | ORAL_CAPSULE | Freq: Three times a day (TID) | ORAL | 11 refills | Status: DC

## 2023-01-02 MED ORDER — SULFAMETHOXAZOLE-TRIMETHOPRIM 800-160 MG PO TABS: 1.0000 | ORAL_TABLET | Freq: Two times a day (BID) | ORAL | 0 refills | Status: DC

## 2023-01-02 NOTE — Telephone Encounter (Signed)
Patient called back and could barely understand her but she was at [facility] and said they didn't have her cream. Advised I would call pharmacy. Spoke with Harrold Donath at [facility] and patient has picked up Bactrim but lidocaine and mupirocin cream has not been mixed up. They are aware she has limited transportation will fill as soon as possible.  I called patient back and updated her on script status. Patient appreciative of call.

## 2023-01-02 NOTE — Addendum Note (Signed)
Addended by: Danne Harbor on: 01/02/2023 09:36 AM   Modules accepted: Orders

## 2023-01-02 NOTE — Telephone Encounter (Signed)
[facility]) prescription for Mupirocin-Lidocaine 2-2 % OINT ; Is that suppose to be a compound? We are not sure what the neurologist is trying to prescribe. Would like a call back

## 2023-01-02 NOTE — Progress Notes (Signed)
Chief Complaint  Patient presents with   Myasthenia Gravis    Rm15, alone, Infusion requires labs igg, iga panel. I have pended order. PT DIDN'T GET CHEST XRAY DUE TO LARGE BOIL UNDER RIGHT ARM PIT. RED, INFLAMMED, PUS FILLED.       ASSESSMENT AND PLAN  Darianne Muralles Clyne is a 37 y.o. female   Seropositive generalized myasthenia gravis,  Positive binding antibody, Responding very well to steroid treatment, continue tapering prednisone 40 mg daily for 2 weeks, 10 mg decrement every 2 weeks remaining at 20 mg daily Tolerating CellCept 500 mg 2 tablets twice a day Mestinon 60 mg 3 times a day as needed She had significant weakness to the point of difficulty breathing, will try IVIG treatment, when she is show significant improvement, remain on p.o. medications Complete evaluation of CT chest to rule out thymus pathology Right armpit folliculitis  Bactrim twice a day for a week, mupirocin local cream, warm compression, follow-up with PCP in 1 week   Myasthenia gravis, with exacerbation  She has been out of treatment for over 1 year, profound bulbar weakness, moderate neck flexion weakness, mild to moderate limb muscle weakness, significant dysphagia, dysarthria,  I have suggest hospital admission for treatment, she has 2 young children to take care of at home,  IV Solu-Medrol 1 g for 3 consecutive days  Start CellCept 500 mg 2 tablets twice a day  Mestinon 60 mg 3-4 times a day  Laboratory evaluation today  Repetitive nerve conduction study  DIAGNOSTIC DATA (LABS, IMAGING, TESTING) - I reviewed patient records, labs, notes, testing and imaging myself where available.   MEDICAL HISTORY:  JOURNII NIERMAN, is a 37 year old female seen in request by   her primary care PA [facility], [place] for evaluation of weakness, she is accompanied by her aunt at today's visit December 19, 2022  I reviewed and summarized the referring note. PMHX.  She was previously under the care of of local  neurologist Dr. Judithann Sheen, which has left the practice for more than 1 year.  She was given the diagnosis of mild tenia gravis, but I did not find serology test in the system, in December 2018, about 6 to 7 months post the pattern of her second child, she has developed dysarthria, dysphagia, double vision, droopy eyelid, trouble walking, was evaluated by Dr. Gerilyn Pilgrim, was given the diagnosis of myasthenia gravis, treated with IVIG once, complains of explosive headaches," as if I am going to die", also Mestinon 30 mg 3 times a day, prednisone, CellCept, which has helped her symptoms,  Even at her best, with all above treatment, she still has difficulty talking, swallowing, but not to such degree, since 2023, she has been out of all her medications, symptoms gradually getting worse, now she has such difficulty speaking, swallowing, sometimes shortness of breath, neck job, limb muscle weakness, double vision, difficult to function in the day to day life, but she has 2 young children, she is the sole caregiver, denies hospital admission  Lab in 2024, negative pregnancy test, normal CBC, BMP showed low sodium 134  Urine drug screen was positive for amphetamine, benzodiazepine, marijuana, methadone in November 2022  Oct 7th 2024: She had a significant improvement since IV Solu-Medrol 1000 mg daily for 3 consecutive days followed by prednisone tapering, started at 60 mg daily now on 40 mg since January 02, 2023, she can talk better, swallowing better, no significant limb muscle weakness  She has developed significant underarm folliculitis, with excessive pus drain,  I helped her to drain the underarm folliculitis, prescription for Bactrim twice daily for 7 days, mupirocin cream, advised for warm compression, follow-up with primary care within 7 days  Laboratory evaluation showed positive acetylcholine binding antibody 0.5, blocking antibody 38, modulating antibody 46, mild elevated A1c 5.7, elevated ESR of  53  Negative HIV, RPR, B12, TSH, CPK,  PHYSICAL EXAM:   Vitals:   01/02/23 1118  BP: 132/82  Pulse: 88  Weight: 150 lb 8 oz (68.3 kg)  Height: 5\' 3"  (1.6 m)    Body mass index is 26.66 kg/m.  PHYSICAL EXAMNIATION:  Gen: NAD, conversant, well nourised, well groomed                     Cardiovascular: Regular rate rhythm, no peripheral edema, warm, nontender. Eyes: Conjunctivae clear without exudates or hemorrhage Neck: Supple, no carotid bruits. Pulmonary: Poor air movement at the base of her lung  NEUROLOGICAL EXAM:  MENTAL STATUS: Speech/cognition: Awake, alert, oriented to history taking and casual conversation, but severe dysarthria, CRANIAL NERVES: CN II: Visual fields are full to confrontation. Pupils are round equal and briskly reactive to light.;  CN III, IV, VI: extraocular movement are normal.  Cover and uncover testing demonstrate mild bilateral exophoria, CN V: Facial sensation is intact to light touch CN VII: Mild to moderate eye closure, cheek puff weakness, was not able to close her eye CN VIII: Hearing is normal to causal conversation. CN IX, X: Phonation is normal. CN XI: Head turning and shoulder shrug are intact  MOTOR: Mild neck flexion weakness, no significant bilateral upper and lower extremity proximal muscle weakness REFLEXES: Reflexes are 2+ and symmetric at the biceps, triceps, knees, and ankles. Plantar responses are flexor.  SENSORY: Intact to light touch, pinprick and vibratory sensation are intact in fingers and toes.  COORDINATION: There is no trunk or limb dysmetria noted.  GAIT/STANCE: Get up from seated position arm crossed, steady gait  REVIEW OF SYSTEMS:  Full 14 system review of systems performed and notable only for as above All other review of systems were negative.   ALLERGIES: No Known Allergies  HOME MEDICATIONS: Current Outpatient Medications  Medication Sig Dispense Refill   acetaminophen (TYLENOL) 325 MG tablet  Take 2 tablets (650 mg total) by mouth every 6 (six) hours as needed for mild pain, fever or headache (or Fever >/= 101). 30 tablet 1   ALPRAZolam (XANAX) 0.5 MG tablet Take 1 tablet (0.5 mg total) by mouth at bedtime as needed for anxiety. 10 tablet 0   methadone (DOLOPHINE) 10 MG/ML solution Take 100 mg by mouth every morning.      mycophenolate (CELLCEPT) 500 MG tablet Take 2 tablets (1,000 mg total) by mouth 2 (two) times daily. 60 tablet 11   predniSONE (DELTASONE) 10 MG tablet Take 10 mg by mouth daily.     pyridostigmine (MESTINON) 60 MG tablet Take 1 tablet (60 mg total) by mouth 4 (four) times daily. 120 tablet 11   VENTOLIN HFA 108 (90 Base) MCG/ACT inhaler Inhale 2 puffs into the lungs every 4 (four) hours as needed.     pantoprazole (PROTONIX) 40 MG tablet Take 1 tablet (40 mg total) by mouth daily. 30 tablet 1   No current facility-administered medications for this visit.    PAST MEDICAL HISTORY: Past Medical History:  Diagnosis Date   Depression    Myasthenia gravis (HCC)    Seizures (HCC)     PAST SURGICAL HISTORY: Past Surgical History:  Procedure Laterality Date   arm surgery Left    rod in left arm   COMPRESSION HIP SCREW Right 12/27/2018   Procedure: OPEN REDUCTION INTERNAL FIXATION RIGHT HIP;  Surgeon: Vickki Hearing, MD;  Location: [facility];  Service: Orthopedics;  Laterality: Right;   LEG SURGERY Bilateral    rods in legs   MOUTH SURGERY      FAMILY HISTORY: Family History  Problem Relation Age of Onset   Liver cancer Mother     SOCIAL HISTORY: Social History   Socioeconomic History   Marital status: Single    Spouse name: Not on file   Number of children: Not on file   Years of education: Not on file   Highest education level: Not on file  Occupational History   Not on file  Tobacco Use   Smoking status: Some Days    Current packs/day: 0.50    Types: Cigarettes   Smokeless tobacco: Never  Vaping Use   Vaping status: Never Used   Substance and Sexual Activity   Alcohol use: Yes    Comment: occ   Drug use: Not Currently    Types: Cocaine, Marijuana    Comment: methadone   Sexual activity: Yes    Birth control/protection: Abstinence  Other Topics Concern   Not on file  Social History Narrative   Right handed   Lives with son   Caffeine-2-3 cups daily   Social Determinants of Health   Financial Resource Strain: Not on file  Food Insecurity: Not on file  Transportation Needs: No Transportation Needs (12/14/2022)   Received from [facility]   PRAPARE - Transportation    Lack of Transportation (Medical): No    Lack of Transportation (Non-Medical): No  Physical Activity: Not on file  Stress: Not on file  Social Connections: Not on file  Intimate Partner Violence: Not At Risk (12/14/2022)   Received from [facility] [place]   Humiliation, Afraid, Rape, and Kick questionnaire    Fear of Current or Ex-Partner: No    Emotionally Abused: No    Physically Abused: No    Sexually Abused: No      Levert Feinstein, M.D. Ph.D.  [facility] [address], [address] [place], [place] [address] Ph: [phone] Fax: [phone]  CC:  [place], [place], PA-C [address] [place],  [place] [address]  [place], [place], PA-C    Total time spent reviewing the chart, obtaining history, examined patient, ordering tests, documentation, consultations and family, care coordination was  66

## 2023-01-02 NOTE — Telephone Encounter (Signed)
To to [place] at [facility]. Reviewed medication order for mupirocin and lidocaine for total of 30 grams. Mallory Mitchell verbalized understanding

## 2023-01-02 NOTE — Telephone Encounter (Signed)
Order faxed to [facility]

## 2023-01-03 ENCOUNTER — Telehealth: Payer: Self-pay | Admitting: Neurology

## 2023-01-03 LAB — CBC WITH DIFFERENTIAL/PLATELET
Basophils Absolute: 0.1 10*3/uL (ref 0.0–0.2)
Basos: 0 %
EOS (ABSOLUTE): 0 10*3/uL (ref 0.0–0.4)
Eos: 0 %
Hematocrit: 36.6 % (ref 34.0–46.6)
Hemoglobin: 11.7 g/dL (ref 11.1–15.9)
Immature Grans (Abs): 0.9 10*3/uL — ABNORMAL HIGH (ref 0.0–0.1)
Immature Granulocytes: 4 %
Lymphocytes Absolute: 1.6 10*3/uL (ref 0.7–3.1)
Lymphs: 7 %
MCH: 26.9 pg (ref 26.6–33.0)
MCHC: 32 g/dL (ref 31.5–35.7)
MCV: 84 fL (ref 79–97)
Monocytes Absolute: 0.4 10*3/uL (ref 0.1–0.9)
Monocytes: 2 %
Neutrophils Absolute: 20.7 10*3/uL — ABNORMAL HIGH (ref 1.4–7.0)
Neutrophils: 87 %
Platelets: 487 10*3/uL — ABNORMAL HIGH (ref 150–450)
RBC: 4.35 x10E6/uL (ref 3.77–5.28)
RDW: 12.9 % (ref 11.7–15.4)
WBC: 23.6 10*3/uL (ref 3.4–10.8)

## 2023-01-03 LAB — IGG, IGA, IGM
IgA/Immunoglobulin A, Serum: 197 mg/dL (ref 87–352)
IgG (Immunoglobin G), Serum: 810 mg/dL (ref 586–1602)
IgM (Immunoglobulin M), Srm: 233 mg/dL — ABNORMAL HIGH (ref 26–217)

## 2023-01-03 NOTE — Telephone Encounter (Signed)
Please call patient, evidence of elevated WBC 23.6 with 20.7% neutrophil consistent with infection,  Please make sure she is taking her antibiotic as prescribed, follow-up with primary care in 1 week for her large size right axillary area folliculitis,/cellulitis  She is taking steroid immunosuppressive treatment, high risk for infection to spread quickly, leading to more complications,

## 2023-01-04 ENCOUNTER — Telehealth: Payer: Self-pay

## 2023-01-04 NOTE — Telephone Encounter (Signed)
Call to patient, left detailed message on patients voicemail and asked her to call office back and confirm she received message.

## 2023-01-04 NOTE — Telephone Encounter (Signed)
Received call from Gerilyn Pilgrim from [facility], he is inquiring on patient previous IVIG treatments. He states patient had complained of headaches post IVIG treatment in the past and was seen in ED, I explained the only documentation I could fine was an ER visit post IVIG treatment. He wanted to know exact name if IVIG patient receiving to try and avoid post infusion headache. We did not have that as patient was not infusion here. NP established 11/2022. He stated he was going to check with [facility] pharmacy on fluids prior to infusion and reach back out/

## 2023-01-05 NOTE — Telephone Encounter (Signed)
Gerilyn Pilgrim at [facility] called to say that he was faxing over orders in regard to previous message with April RN and would need review from provider to sign and fax back to him

## 2023-01-09 ENCOUNTER — Telehealth: Payer: Self-pay

## 2023-01-09 NOTE — Telephone Encounter (Signed)
Faxed signed orders to [facility]

## 2023-02-09 NOTE — Telephone Encounter (Signed)
App on Nov 18th 2024

## 2023-02-09 NOTE — Telephone Encounter (Signed)
Gerilyn Pilgrim from [facility] called to inform provider that they have had trouble getting in touch with the pt. 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  Gerilyn Pilgrim states that now this medication is going to be taken over by nursing and they will be taking everything to the pt's home and making sure that no starter kits or anything else is left behind. If any questions [phone] can be called.

## 2023-02-13 ENCOUNTER — Encounter: Payer: Self-pay | Admitting: Neurology

## 2023-02-13 ENCOUNTER — Ambulatory Visit: Payer: MEDICAID | Admitting: Neurology

## 2023-04-10 ENCOUNTER — Ambulatory Visit: Payer: 59 | Admitting: Neurology

## 2023-04-10 ENCOUNTER — Encounter: Payer: Self-pay | Admitting: Neurology

## 2023-06-16 ENCOUNTER — Other Ambulatory Visit: Payer: Self-pay

## 2023-06-16 ENCOUNTER — Emergency Department (HOSPITAL_COMMUNITY)

## 2023-06-16 ENCOUNTER — Emergency Department (HOSPITAL_COMMUNITY): Admission: EM | Admit: 2023-06-16 | Discharge: 2023-06-16 | Disposition: A | Discharge status: Home / Self Care

## 2023-06-16 ENCOUNTER — Encounter (HOSPITAL_COMMUNITY): Payer: Self-pay

## 2023-06-16 DIAGNOSIS — M79651 Pain in right thigh: Secondary | ICD-10-CM | POA: Insufficient documentation

## 2023-06-16 DIAGNOSIS — M25551 Pain in right hip: Secondary | ICD-10-CM | POA: Insufficient documentation

## 2023-06-16 DIAGNOSIS — Y92410 Unspecified street and highway as the place of occurrence of the external cause: Secondary | ICD-10-CM | POA: Insufficient documentation

## 2023-06-16 DIAGNOSIS — M25561 Pain in right knee: Secondary | ICD-10-CM | POA: Insufficient documentation

## 2023-06-16 LAB — PREGNANCY, URINE: Preg Test, Ur: NEGATIVE

## 2023-06-16 LAB — POC URINE PREG, ED: Preg Test, Ur: NEGATIVE

## 2023-06-16 MED ORDER — HYDROCODONE-ACETAMINOPHEN 5-325 MG PO TABS
1.0000 | ORAL_TABLET | Freq: Once | ORAL | Status: AC
  Administered 2023-06-16: 1 via ORAL
  Filled 2023-06-16: qty 1

## 2023-06-16 MED ORDER — LIDOCAINE 4 % EX PTCH: 1.0000 | MEDICATED_PATCH | CUTANEOUS | 0 refills | Status: AC

## 2023-06-16 MED ORDER — IBUPROFEN 400 MG PO TABS
600.0000 mg | ORAL_TABLET | Freq: Once | ORAL | Status: AC
  Administered 2023-06-16: 600 mg via ORAL
  Filled 2023-06-16: qty 1

## 2023-06-16 NOTE — ED Notes (Signed)
 C-collar placed on pt per Dr Criss Alvine verbal order
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
## 2023-06-16 NOTE — Discharge Instructions (Addendum)
 As discussed he may take over-the-counter Tylenol alternating with ibuprofen for pain.  We also prescribing a lidocaine patch that may you may use.  Return immediately if develop fevers, chills, sudden onset headache, vision changes, unilateral weakness, chest pain, shortness of breath, abdominal pain, inability to eat or drink due to nausea vomiting or any new or worsening symptoms that are concerning to you.

## 2023-06-17 ENCOUNTER — Encounter (HOSPITAL_COMMUNITY): Payer: Self-pay

## 2023-07-24 ENCOUNTER — Encounter (HOSPITAL_COMMUNITY): Payer: Self-pay

## 2023-07-24 ENCOUNTER — Emergency Department (HOSPITAL_COMMUNITY)
Admission: EM | Admit: 2023-07-24 | Discharge: 2023-07-24 | Disposition: A | Discharge status: Home / Self Care | Attending: Emergency Medicine | Admitting: Emergency Medicine

## 2023-07-24 ENCOUNTER — Other Ambulatory Visit: Payer: Self-pay

## 2023-07-24 DIAGNOSIS — L03115 Cellulitis of right lower limb: Secondary | ICD-10-CM | POA: Insufficient documentation

## 2023-07-24 DIAGNOSIS — M79651 Pain in right thigh: Secondary | ICD-10-CM | POA: Diagnosis present

## 2023-07-24 MED ORDER — SULFAMETHOXAZOLE-TRIMETHOPRIM 800-160 MG PO TABS
1.0000 | ORAL_TABLET | Freq: Once | ORAL | Status: AC
  Administered 2023-07-24: 1 via ORAL
  Filled 2023-07-24: qty 1

## 2023-07-24 MED ORDER — SULFAMETHOXAZOLE-TRIMETHOPRIM 800-160 MG PO TABS: 1.0000 | ORAL_TABLET | Freq: Two times a day (BID) | ORAL | 0 refills | Status: AC

## 2023-07-24 NOTE — Discharge Instructions (Addendum)
 Be sure to speak with your physician at [facility] for a assessment.  Return here for concerning changes in your condition.
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
## 2023-12-13 ENCOUNTER — Other Ambulatory Visit: Payer: Self-pay | Admitting: Neurology
